# Patient Record
Sex: Male | Born: 1960 | Race: Black or African American | Hispanic: No | Marital: Single | Smoking: Current every day smoker
Health system: Southern US, Community
[De-identification: ages and names within clinical notes are randomized; demographics above are authoritative.]

## PROBLEM LIST (undated history)

## (undated) DIAGNOSIS — R519 Headache, unspecified: Secondary | ICD-10-CM

## (undated) DIAGNOSIS — I10 Essential (primary) hypertension: Secondary | ICD-10-CM

## (undated) HISTORY — PX: APPENDECTOMY: SHX54

## (undated) SURGERY — APPENDECTOMY, LAPAROSCOPIC
Anesthesia: General

---

## 1998-02-24 ENCOUNTER — Emergency Department (HOSPITAL_COMMUNITY): Admission: EM | Admit: 1998-02-24 | Discharge: 1998-02-24 | Payer: Self-pay | Admitting: Emergency Medicine

## 2001-09-05 ENCOUNTER — Emergency Department (HOSPITAL_COMMUNITY): Admission: EM | Admit: 2001-09-05 | Discharge: 2001-09-05 | Payer: Self-pay | Admitting: Emergency Medicine

## 2001-12-12 ENCOUNTER — Emergency Department (HOSPITAL_COMMUNITY): Admission: EM | Admit: 2001-12-12 | Discharge: 2001-12-13 | Payer: Self-pay | Admitting: Emergency Medicine

## 2002-02-20 ENCOUNTER — Encounter: Payer: Self-pay | Admitting: Oral Surgery

## 2002-02-20 ENCOUNTER — Inpatient Hospital Stay (HOSPITAL_COMMUNITY): Admission: EM | Admit: 2002-02-20 | Discharge: 2002-02-20 | Payer: Self-pay | Admitting: Emergency Medicine

## 2002-02-20 ENCOUNTER — Encounter: Payer: Self-pay | Admitting: Emergency Medicine

## 2004-05-04 ENCOUNTER — Emergency Department (HOSPITAL_COMMUNITY): Admission: EM | Admit: 2004-05-04 | Discharge: 2004-05-04 | Payer: Self-pay | Admitting: Emergency Medicine

## 2006-04-30 ENCOUNTER — Emergency Department (HOSPITAL_COMMUNITY): Admission: EM | Admit: 2006-04-30 | Discharge: 2006-04-30 | Payer: Self-pay | Admitting: Emergency Medicine

## 2009-07-10 ENCOUNTER — Emergency Department (HOSPITAL_COMMUNITY): Admission: EM | Admit: 2009-07-10 | Discharge: 2009-07-10 | Payer: Self-pay | Admitting: Emergency Medicine

## 2009-11-21 ENCOUNTER — Emergency Department (HOSPITAL_COMMUNITY): Admission: EM | Admit: 2009-11-21 | Discharge: 2009-11-21 | Payer: Self-pay | Admitting: Emergency Medicine

## 2009-12-01 ENCOUNTER — Emergency Department (HOSPITAL_COMMUNITY): Admission: EM | Admit: 2009-12-01 | Discharge: 2009-12-01 | Payer: Self-pay | Admitting: Emergency Medicine

## 2010-11-12 NOTE — Op Note (Signed)
NAME:  Duane Lambert, Duane Lambert                         ACCOUNT NO.:  [id number]   MEDICAL RECORD NO.:  [id number]                   PATIENT TYPE:  INP   LOCATION:  5735                                 FACILITY:  [facility]   PHYSICIAN:  Dora Sims, M.D.               DATE OF BIRTH:  1961-01-05   DATE OF PROCEDURE:  02/20/2002  DATE OF DISCHARGE:                                 OPERATIVE REPORT   PREOPERATIVE DIAGNOSES:  Right open parasymphysis fracture of the mandible,  left open body fracture of the mandible.   POSTOPERATIVE DIAGNOSES:  Right open parasymphysis fracture of the mandible,  left open body fracture of the mandible.   OPERATIVE PROCEDURE:  Closed reduction, maxillomandibular fixation (MMF).   ANESTHESIA:  General endotracheal tube.   SURGEON:  Dora Sims, M.D.   BRIEF HISTORY AND PHYSICAL:  This is a 50 year old gentleman who was seen in  the emergency department, status post blunt trauma to the face.  He had  adequate dentition on evaluation for stabilization of his mandible fractures  by means of a closed reduction procedure.  All appropriate consents were  obtained.  The patient was admitted to the hospital.   DESCRIPTION OF PROCEDURE:  He was maintained NPO the night before surgery,  brought to the operating room, placed in the supine position.  All  anesthesia monitors were found to be working appropriately.  He was  appropriately padded, and a nasoendotracheal tube intubation was completed  with minimal difficulty.  This was confirmed by clear bilateral breath  sounds, as well as positive end tidal CO2. Once this was done, the patient  was prepped and draped in the normal sterile fashion.  A throat pack was  placed.  At that time, then approximately 6 cc of 2% lidocaine with  1:100,000 parts epinephrine was injected into both maxillary and mandibular  vestibules with an intra-alveolar nerve block to the left side at the  fracture site.  Once this was done, a  24-gauge wire that had been  prestretched and cut was placed, circumdental wires around first the  maxillary teeth in order to stabilize the Erich arch bar for the maxilla.  The procedure was then carried out on the mandibular dentition in a similar  fashion.  A bridle wire was placed around the lower incisors in order to aid  in reduction of the fracture.  The throat pack was then removed.  The throat  was suctioned of all secretions, and he was placed into MMF.  This occlusion  was stable.  The patient was allowed to awaken from general anesthesia.  He  will be maintained on liquid pain killers, as well as antibiotics for  approximately ten days.  He will be on a full liquid diet and followed in my  office until complete healing of his bilateral mandible fractures.  Minimal  blood was lost.  No drains were placed.  Nothing was sent for pathology.                                               Dora Sims, M.D.    RJR/MEDQ  D:  02/26/2002  T:  02/27/2002  Job:  [id number]

## 2011-09-08 ENCOUNTER — Encounter (HOSPITAL_COMMUNITY): Payer: Self-pay | Admitting: *Deleted

## 2011-09-08 ENCOUNTER — Ambulatory Visit (HOSPITAL_COMMUNITY)
Admission: EM | Admit: 2011-09-08 | Discharge: 2011-09-10 | Disposition: A | Discharge status: Home / Self Care | Payer: Self-pay | Attending: Surgery | Admitting: Surgery

## 2011-09-08 DIAGNOSIS — F17200 Nicotine dependence, unspecified, uncomplicated: Secondary | ICD-10-CM | POA: Insufficient documentation

## 2011-09-08 DIAGNOSIS — K3580 Unspecified acute appendicitis: Secondary | ICD-10-CM | POA: Insufficient documentation

## 2011-09-08 DIAGNOSIS — R109 Unspecified abdominal pain: Secondary | ICD-10-CM | POA: Insufficient documentation

## 2011-09-08 DIAGNOSIS — K37 Unspecified appendicitis: Secondary | ICD-10-CM

## 2011-09-08 LAB — COMPREHENSIVE METABOLIC PANEL
Albumin: 4 g/dL (ref 3.5–5.2)
Alkaline Phosphatase: 80 U/L (ref 39–117)
BUN: 11 mg/dL (ref 6–23)
Chloride: 99 mEq/L (ref 96–112)
Creatinine, Ser: 0.61 mg/dL (ref 0.50–1.35)
GFR calc Af Amer: >90 mL/min (ref >90)
Glucose, Bld: 121 mg/dL — ABNORMAL HIGH (ref 70–99)
Potassium: 3.5 mEq/L (ref 3.5–5.1)
Total Bilirubin: 0.4 mg/dL (ref 0.3–1.2)

## 2011-09-08 LAB — LIPASE, BLOOD: Lipase: 12 U/L (ref 11–59)

## 2011-09-08 LAB — DIFFERENTIAL
Basophils Relative: 0 % (ref 0–1)
Eosinophils Absolute: 0 10*3/uL (ref 0.0–0.7)
Lymphs Abs: 1.8 10*3/uL (ref 0.7–4.0)
Monocytes Absolute: 1.4 10*3/uL — ABNORMAL HIGH (ref 0.1–1.0)
Monocytes Relative: 10 % (ref 3–12)
Neutrophils Relative %: 77 % (ref 43–77)

## 2011-09-08 LAB — CBC
HCT: 41.3 % (ref 39.0–52.0)
Hemoglobin: 14.4 g/dL (ref 13.0–17.0)
MCH: 29.4 pg (ref 26.0–34.0)
MCHC: 34.9 g/dL (ref 30.0–36.0)
RBC: 4.89 MIL/uL (ref 4.22–5.81)

## 2011-09-08 LAB — URINALYSIS, ROUTINE W REFLEX MICROSCOPIC
Bilirubin Urine: NEGATIVE
Glucose, UA: NEGATIVE mg/dL
Hgb urine dipstick: NEGATIVE
Ketones, ur: NEGATIVE mg/dL
Specific Gravity, Urine: 1.024 (ref 1.005–1.030)
pH: 8.5 — ABNORMAL HIGH (ref 5.0–8.0)

## 2011-09-08 LAB — URINE MICROSCOPIC-ADD ON

## 2011-09-08 NOTE — ED Notes (Signed)
Generalized abd pain since 1600 today no nor v.  No previous history

## 2011-09-09 ENCOUNTER — Emergency Department (HOSPITAL_COMMUNITY): Payer: Self-pay | Admitting: Certified Registered Nurse Anesthetist

## 2011-09-09 ENCOUNTER — Encounter (HOSPITAL_COMMUNITY): Payer: Self-pay | Admitting: Certified Registered Nurse Anesthetist

## 2011-09-09 ENCOUNTER — Inpatient Hospital Stay: Admit: 2011-09-09 | Payer: Self-pay | Admitting: General Surgery

## 2011-09-09 ENCOUNTER — Encounter (HOSPITAL_COMMUNITY): Payer: Self-pay | Admitting: Emergency Medicine

## 2011-09-09 ENCOUNTER — Emergency Department (HOSPITAL_COMMUNITY): Payer: Self-pay

## 2011-09-09 ENCOUNTER — Encounter (HOSPITAL_COMMUNITY)
Admission: EM | Disposition: A | Discharge status: Home / Self Care | Payer: Self-pay | Source: Home / Self Care | Attending: Emergency Medicine

## 2011-09-09 DIAGNOSIS — R1031 Right lower quadrant pain: Secondary | ICD-10-CM

## 2011-09-09 DIAGNOSIS — K3580 Unspecified acute appendicitis: Secondary | ICD-10-CM

## 2011-09-09 HISTORY — PX: LAPAROSCOPIC APPENDECTOMY: SHX408

## 2011-09-09 LAB — CBC
Hemoglobin: 13.4 g/dL (ref 13.0–17.0)
MCH: 29.2 pg (ref 26.0–34.0)
MCHC: 34.6 g/dL (ref 30.0–36.0)
MCV: 84.3 fL (ref 78.0–100.0)
Platelets: 185 10*3/uL (ref 150–400)
RBC: 4.59 MIL/uL (ref 4.22–5.81)

## 2011-09-09 LAB — CREATININE, SERUM: Creatinine, Ser: 0.57 mg/dL (ref 0.50–1.35)

## 2011-09-09 SURGERY — APPENDECTOMY, LAPAROSCOPIC
Anesthesia: General | Site: Abdomen | Wound class: Contaminated

## 2011-09-09 MED ORDER — ENOXAPARIN SODIUM 40 MG/0.4ML ~~LOC~~ SOLN
40.0000 mg | SUBCUTANEOUS | Status: DC
  Administered 2011-09-09: 40 mg via SUBCUTANEOUS
  Filled 2011-09-09 (×2): qty 0.4

## 2011-09-09 MED ORDER — 0.9 % SODIUM CHLORIDE (POUR BTL) OPTIME
TOPICAL | Status: DC | PRN
  Administered 2011-09-09: 1000 mL

## 2011-09-09 MED ORDER — MIDAZOLAM HCL 5 MG/5ML IJ SOLN
INTRAMUSCULAR | Status: DC | PRN
  Administered 2011-09-09: 2 mg via INTRAVENOUS

## 2011-09-09 MED ORDER — HYDROCODONE-ACETAMINOPHEN 5-325 MG PO TABS
1.0000 | ORAL_TABLET | ORAL | Status: DC | PRN
  Administered 2011-09-09 – 2011-09-10 (×3): 2 via ORAL
  Filled 2011-09-09 (×3): qty 2

## 2011-09-09 MED ORDER — ONDANSETRON HCL 4 MG PO TABS: 4.0000 mg | ORAL_TABLET | Freq: Four times a day (QID) | ORAL | Status: DC | PRN

## 2011-09-09 MED ORDER — LACTATED RINGERS IV SOLN
INTRAVENOUS | Status: DC | PRN
  Administered 2011-09-09: 06:00:00 via INTRAVENOUS

## 2011-09-09 MED ORDER — LIDOCAINE HCL (CARDIAC) 20 MG/ML IV SOLN
INTRAVENOUS | Status: DC | PRN
  Administered 2011-09-09: 60 mg via INTRAVENOUS

## 2011-09-09 MED ORDER — PROPOFOL 10 MG/ML IV EMUL
INTRAVENOUS | Status: DC | PRN
  Administered 2011-09-09: 160 mg via INTRAVENOUS

## 2011-09-09 MED ORDER — KETOROLAC TROMETHAMINE 30 MG/ML IJ SOLN
30.0000 mg | Freq: Once | INTRAMUSCULAR | Status: AC
  Administered 2011-09-09: 30 mg via INTRAVENOUS
  Filled 2011-09-09: qty 1

## 2011-09-09 MED ORDER — GLYCOPYRROLATE 0.2 MG/ML IJ SOLN
INTRAMUSCULAR | Status: DC | PRN
  Administered 2011-09-09: .5 mg via INTRAVENOUS

## 2011-09-09 MED ORDER — SODIUM CHLORIDE 0.9 % IV SOLN
Freq: Once | INTRAVENOUS | Status: AC
  Administered 2011-09-09: 03:00:00 via INTRAVENOUS

## 2011-09-09 MED ORDER — ROCURONIUM BROMIDE 100 MG/10ML IV SOLN
INTRAVENOUS | Status: DC | PRN
  Administered 2011-09-09: 30 mg via INTRAVENOUS

## 2011-09-09 MED ORDER — KCL IN DEXTROSE-NACL 20-5-0.45 MEQ/L-%-% IV SOLN
INTRAVENOUS | Status: DC
  Administered 2011-09-09: 1000 mL via INTRAVENOUS
  Administered 2011-09-10: 02:00:00 via INTRAVENOUS
  Filled 2011-09-09 (×3): qty 1000

## 2011-09-09 MED ORDER — IOHEXOL 300 MG/ML  SOLN
80.0000 mL | Freq: Once | INTRAMUSCULAR | Status: AC | PRN
  Administered 2011-09-09: 80 mL via INTRAVENOUS

## 2011-09-09 MED ORDER — BUPIVACAINE-EPINEPHRINE 0.25% -1:200000 IJ SOLN
INTRAMUSCULAR | Status: DC | PRN
  Administered 2011-09-09: 1 mL

## 2011-09-09 MED ORDER — ONDANSETRON HCL 4 MG/2ML IJ SOLN: 4.0000 mg | Freq: Four times a day (QID) | INTRAMUSCULAR | Status: DC | PRN

## 2011-09-09 MED ORDER — FENTANYL CITRATE 0.05 MG/ML IJ SOLN
INTRAMUSCULAR | Status: DC | PRN
  Administered 2011-09-09 (×3): 50 ug via INTRAVENOUS
  Administered 2011-09-09: 100 ug via INTRAVENOUS

## 2011-09-09 MED ORDER — CEFOXITIN SODIUM 1 G IV SOLR
1.0000 g | INTRAVENOUS | Status: DC | PRN
  Administered 2011-09-09: 1 g via INTRAVENOUS

## 2011-09-09 MED ORDER — KETOROLAC TROMETHAMINE 15 MG/ML IJ SOLN
15.0000 mg | Freq: Four times a day (QID) | INTRAMUSCULAR | Status: DC
  Administered 2011-09-09 – 2011-09-10 (×4): 15 mg via INTRAVENOUS
  Filled 2011-09-09 (×8): qty 1

## 2011-09-09 MED ORDER — HYDROMORPHONE HCL PF 1 MG/ML IJ SOLN
0.2500 mg | INTRAMUSCULAR | Status: DC | PRN
  Administered 2011-09-09 (×2): 0.5 mg via INTRAVENOUS

## 2011-09-09 MED ORDER — SODIUM CHLORIDE 0.9 % IR SOLN
Status: DC | PRN
  Administered 2011-09-09: 1000 mL

## 2011-09-09 MED ORDER — SODIUM CHLORIDE 0.9 % IV SOLN
INTRAVENOUS | Status: DC | PRN
  Administered 2011-09-09: 06:00:00 via INTRAVENOUS

## 2011-09-09 MED ORDER — HYDROMORPHONE HCL PF 1 MG/ML IJ SOLN: 1.0000 mg | INTRAMUSCULAR | Status: DC | PRN

## 2011-09-09 MED ORDER — ONDANSETRON HCL 4 MG/2ML IJ SOLN
INTRAMUSCULAR | Status: DC | PRN
  Administered 2011-09-09: 4 mg via INTRAVENOUS

## 2011-09-09 MED ORDER — IOHEXOL 300 MG/ML  SOLN
20.0000 mL | INTRAMUSCULAR | Status: DC
  Administered 2011-09-09: 20 mL via ORAL

## 2011-09-09 MED ORDER — NEOSTIGMINE METHYLSULFATE 1 MG/ML IJ SOLN
INTRAMUSCULAR | Status: DC | PRN
  Administered 2011-09-09: 3 mg via INTRAVENOUS

## 2011-09-09 SURGICAL SUPPLY — 51 items
ADH SKN CLS APL DERMABOND .7 (GAUZE/BANDAGES/DRESSINGS) ×1
ADH SKN CLS LQ APL DERMABOND (GAUZE/BANDAGES/DRESSINGS) ×1
APPLIER CLIP ROT 10 11.4 M/L (STAPLE)
APR CLP MED LRG 11.4X10 (STAPLE)
BAG SPEC RTRVL LRG 6X4 10 (ENDOMECHANICALS) ×1
BLADE SURG ROTATE 9660 (MISCELLANEOUS) ×1 IMPLANT
CANISTER SUCTION 2500CC (MISCELLANEOUS) ×2 IMPLANT
CHLORAPREP W/TINT 26ML (MISCELLANEOUS) ×2 IMPLANT
CLIP APPLIE ROT 10 11.4 M/L (STAPLE) IMPLANT
CLOTH BEACON ORANGE TIMEOUT ST (SAFETY) ×2 IMPLANT
COVER SURGICAL LIGHT HANDLE (MISCELLANEOUS) ×2 IMPLANT
CUTTER LINEAR ENDO 35 ETS (STAPLE) ×1 IMPLANT
CUTTER LINEAR ENDO 35 ETS TH (STAPLE) IMPLANT
DECANTER SPIKE VIAL GLASS SM (MISCELLANEOUS) ×1 IMPLANT
DERMABOND ADHESIVE PROPEN (GAUZE/BANDAGES/DRESSINGS) ×1
DERMABOND ADVANCED (GAUZE/BANDAGES/DRESSINGS) ×1
DERMABOND ADVANCED .7 DNX12 (GAUZE/BANDAGES/DRESSINGS) ×1 IMPLANT
DERMABOND ADVANCED .7 DNX6 (GAUZE/BANDAGES/DRESSINGS) IMPLANT
DRAPE UTILITY 15X26 W/TAPE STR (DRAPE) ×4 IMPLANT
DRSG TEGADERM 4X4.75 (GAUZE/BANDAGES/DRESSINGS) ×1 IMPLANT
ELECT REM PT RETURN 9FT ADLT (ELECTROSURGICAL) ×2
ELECTRODE REM PT RTRN 9FT ADLT (ELECTROSURGICAL) ×1 IMPLANT
ENDOLOOP SUT PDS II  0 18 (SUTURE)
ENDOLOOP SUT PDS II 0 18 (SUTURE) IMPLANT
GLOVE BIO SURGEON STRL SZ 6.5 (GLOVE) ×2 IMPLANT
GLOVE BIOGEL PI IND STRL 7.0 (GLOVE) IMPLANT
GLOVE BIOGEL PI IND STRL 8 (GLOVE) ×1 IMPLANT
GLOVE BIOGEL PI INDICATOR 7.0 (GLOVE) ×1
GLOVE BIOGEL PI INDICATOR 8 (GLOVE) ×1
GLOVE ECLIPSE 7.5 STRL STRAW (GLOVE) ×2 IMPLANT
GOWN STRL NON-REIN LRG LVL3 (GOWN DISPOSABLE) ×4 IMPLANT
KIT BASIN OR (CUSTOM PROCEDURE TRAY) ×2 IMPLANT
KIT ROOM TURNOVER OR (KITS) ×2 IMPLANT
NS IRRIG 1000ML POUR BTL (IV SOLUTION) ×2 IMPLANT
PAD ARMBOARD 7.5X6 YLW CONV (MISCELLANEOUS) ×4 IMPLANT
PENCIL BUTTON HOLSTER BLD 10FT (ELECTRODE) IMPLANT
POUCH SPECIMEN RETRIEVAL 10MM (ENDOMECHANICALS) ×2 IMPLANT
RELOAD /EVU35 (ENDOMECHANICALS) ×1 IMPLANT
RELOAD CUTTER ETS 35MM STAND (ENDOMECHANICALS) IMPLANT
SET IRRIG TUBING LAPAROSCOPIC (IRRIGATION / IRRIGATOR) ×2 IMPLANT
SPECIMEN JAR SMALL (MISCELLANEOUS) ×2 IMPLANT
STRIP CLOSURE SKIN 1/2X4 (GAUZE/BANDAGES/DRESSINGS) ×1 IMPLANT
SUT MNCRL AB 4-0 PS2 18 (SUTURE) ×2 IMPLANT
TOWEL OR 17X24 6PK STRL BLUE (TOWEL DISPOSABLE) ×1 IMPLANT
TOWEL OR 17X26 10 PK STRL BLUE (TOWEL DISPOSABLE) ×2 IMPLANT
TRAY FOLEY CATH 14FR (SET/KITS/TRAYS/PACK) ×2 IMPLANT
TRAY LAPAROSCOPIC (CUSTOM PROCEDURE TRAY) ×2 IMPLANT
TROCAR XCEL 12X100 BLDLESS (ENDOMECHANICALS) ×2 IMPLANT
TROCAR XCEL BLUNT TIP 100MML (ENDOMECHANICALS) ×2 IMPLANT
TROCAR XCEL NON-BLD 5MMX100MML (ENDOMECHANICALS) ×2 IMPLANT
WATER STERILE IRR 1000ML POUR (IV SOLUTION) IMPLANT

## 2011-09-09 NOTE — ED Notes (Signed)
OR called and states that they are ready for patient; prepping patient for surgery.  Patient undressed; changed into gown; belongings in bag.  OR Consent form signed and placed in chart.

## 2011-09-09 NOTE — Progress Notes (Signed)
Patient ID: Duane Lambert, male   DOB: June 14, 1961, 51 y.o.   MRN: [id number] Day of Surgery  Subjective: Pt c/o soreness, but minimal pain.  Tolerating liquids.  Doesn't want to go home tonight.  Objective: Vital signs in last 24 hours: Temp:  [97.4 F (36.3 C)-98.7 F (37.1 C)] 98.5 F (36.9 C) (03/15 1300) Pulse Rate:  [62-88] 66  (03/15 1300) Resp:  [12-28] 16  (03/15 1300) BP: (100-155)/(54-97) 130/87 mmHg (03/15 1300) SpO2:  [94 %-100 %] 99 % (03/15 1300) Weight:  [135 lb (61.236 kg)] 135 lb (61.236 kg) (03/15 0900) Last BM Date: 09/08/11  Intake/Output from previous day: 03/14 0701 - 03/15 0700 In: 1600 [I.V.:1600] Out: 250 [Urine:225; Blood:25] Intake/Output this shift: Total I/O In: 450 [I.V.:450] Out: 1025 [Urine:1025]  PE: Abd: soft, appropriately tender, +BS, ND, incisions c/d/i  Lab Results:   Basename 09/09/11 1015 09/08/11 2218  WBC 12.0* 13.9*  HGB 13.4 14.4  HCT 38.7* 41.3  PLT 185 196   BMET  Basename 09/09/11 1015 09/08/11 2218  NA -- 135  K -- 3.5  CL -- 99  CO2 -- 24  GLUCOSE -- 121*  BUN -- 11  CREATININE 0.57 0.61  CALCIUM -- 9.4   PT/INR No results found for this basename: LABPROT:2,INR:2 in the last 72 hours   Studies/Results: Ct Abdomen Pelvis W Contrast  09/09/2011  *RADIOLOGY REPORT*  Clinical Data: Diffuse abdominal  CT ABDOMEN AND PELVIS WITH CONTRAST  Technique:  Multidetector CT imaging of the abdomen and pelvis was performed following the standard protocol during bolus administration of intravenous contrast.  Contrast: 80mL OMNIPAQUE IOHEXOL 300 MG/ML IJ SOLN pain  Comparison: None.  Findings: Limited images through the lung bases demonstrate no significant appreciable abnormality. The heart size is within normal limits. No pleural or pericardial effusion.  Unremarkable liver, biliary system, spleen, pancreas, adrenal glands.  Symmetric renal enhancement.  No hydronephrosis or hydroureter.  The appendix is distended up to 1.1  cm.  There is mucosal hyperenhancement and mucosal edema.  Mild periappendiceal fat stranding.  No bowel obstruction.  No CT evidence for colitis.  No free intraperitoneal air.  Decompressed bladder.  No lymphadenopathy.  There is scattered atherosclerotic calcification of the aorta and its branches. No aneurysmal dilatation.  Bilateral SI joint ankylosis.  No acute osseous abnormality.  IMPRESSION: Acute appendicitis.  Original Report Authenticated By: Waneta Martins, M.D.    Anti-infectives: Anti-infectives    None       Assessment/Plan  1. S/p lap appy  Plan: 1. Advance diet 2. Anticipate dc in am.   LOS: 1 day    Amarri Satterly E 09/09/2011

## 2011-09-09 NOTE — Anesthesia Preprocedure Evaluation (Addendum)
Anesthesia Evaluation  Patient identified by MRN, date of birth, ID band Patient awake    Reviewed: Allergy & Precautions, H&P , NPO status , Patient's Chart, lab work & pertinent test results  Airway Mallampati: III TM Distance: >3 FB Neck ROM: Full    Dental  (+)  Previous hx of fractured mandible. S/P ORIF:   Pulmonary Current Smoker,          Cardiovascular     Neuro/Psych    GI/Hepatic   Endo/Other    Renal/GU      Musculoskeletal   Abdominal   Peds  Hematology   Anesthesia Other Findings   Reproductive/Obstetrics                          Anesthesia Physical Anesthesia Plan  ASA: II and Emergent  Anesthesia Plan: General   Post-op Pain Management:    Induction: Intravenous, Rapid sequence and Cricoid pressure planned  Airway Management Planned: Oral ETT  Additional Equipment:   Intra-op Plan:   Post-operative Plan: Extubation in OR  Informed Consent: I have reviewed the patients History and Physical, chart, labs and discussed the procedure including the risks, benefits and alternatives for the proposed anesthesia with the patient or authorized representative who has indicated his/her understanding and acceptance.   Dental advisory given  Plan Discussed with: CRNA and Anesthesiologist  Anesthesia Plan Comments:         Anesthesia Quick Evaluation

## 2011-09-09 NOTE — H&P (Addendum)
Duane Lambert is an 51 y.o. male.   Chief Complaint: Abdominal pain HPI: Well until about 4:00 PM yesterday, developed periumbilical pain, now localized to RLQ.  History reviewed. No pertinent past medical history.  History reviewed. No pertinent past surgical history.  History reviewed. No pertinent family history. Social History:  reports that he has been smoking.  He does not have any smokeless tobacco history on file. He reports that he does not drink alcohol. His drug history not on file.  Allergies: No Known Allergies  Medications Prior to Admission  Medication Dose Route Frequency Provider Last Rate Last Dose  . 0.9 %  sodium chloride infusion   Intravenous Once Nat Christen, MD 125 mL/hr at 09/09/11 0304    . iohexol (OMNIPAQUE) 300 MG/ML solution 20 mL  20 mL Oral Q1 Hr x 2 Medication Radiologist, MD   20 mL at 09/09/11 0254  . iohexol (OMNIPAQUE) 300 MG/ML solution 80 mL  80 mL Intravenous Once PRN Medication Radiologist, MD   80 mL at 09/09/11 0330   No current outpatient prescriptions on file as of 09/08/2011.    Results for orders placed during the hospital encounter of 09/08/11 (from the past 48 hour(s))  CBC     Status: Abnormal   Collection Time   09/08/11 10:18 PM      Component Value Range Comment   WBC 13.9 (*) 4.0 - 10.5 (K/uL)    RBC 4.89  4.22 - 5.81 (MIL/uL)    Hemoglobin 14.4  13.0 - 17.0 (g/dL)    HCT 62.9  52.8 - 41.3 (%)    MCV 84.5  78.0 - 100.0 (fL)    MCH 29.4  26.0 - 34.0 (pg)    MCHC 34.9  30.0 - 36.0 (g/dL)    RDW 24.4  01.0 - 27.2 (%)    Platelets 196  150 - 400 (K/uL)   DIFFERENTIAL     Status: Abnormal   Collection Time   09/08/11 10:18 PM      Component Value Range Comment   Neutrophils Relative 77  43 - 77 (%)    Neutro Abs 10.7 (*) 1.7 - 7.7 (K/uL)    Lymphocytes Relative 13  12 - 46 (%)    Lymphs Abs 1.8  0.7 - 4.0 (K/uL)    Monocytes Relative 10  3 - 12 (%)    Monocytes Absolute 1.4 (*) 0.1 - 1.0 (K/uL)    Eosinophils Relative  0  0 - 5 (%)    Eosinophils Absolute 0.0  0.0 - 0.7 (K/uL)    Basophils Relative 0  0 - 1 (%)    Basophils Absolute 0.0  0.0 - 0.1 (K/uL)   COMPREHENSIVE METABOLIC PANEL     Status: Abnormal   Collection Time   09/08/11 10:18 PM      Component Value Range Comment   Sodium 135  135 - 145 (mEq/L)    Potassium 3.5  3.5 - 5.1 (mEq/L)    Chloride 99  96 - 112 (mEq/L)    CO2 24  19 - 32 (mEq/L)    Glucose, Bld 121 (*) 70 - 99 (mg/dL)    BUN 11  6 - 23 (mg/dL)    Creatinine, Ser 5.36  0.50 - 1.35 (mg/dL)    Calcium 9.4  8.4 - 10.5 (mg/dL)    Total Protein 7.7  6.0 - 8.3 (g/dL)    Albumin 4.0  3.5 - 5.2 (g/dL)    AST 31  0 -  37 (U/L)    ALT 24  0 - 53 (U/L)    Alkaline Phosphatase 80  39 - 117 (U/L)    Total Bilirubin 0.4  0.3 - 1.2 (mg/dL)    GFR calc non Af Amer >90  >90 (mL/min)    GFR calc Af Amer >90  >90 (mL/min)   LIPASE, BLOOD     Status: Normal   Collection Time   09/08/11 10:18 PM      Component Value Range Comment   Lipase 12  11 - 59 (U/L)   URINALYSIS, ROUTINE W REFLEX MICROSCOPIC     Status: Abnormal   Collection Time   09/08/11 10:20 PM      Component Value Range Comment   Color, Urine YELLOW  YELLOW     APPearance CLOUDY (*) CLEAR     Specific Gravity, Urine 1.024  1.005 - 1.030     pH 8.5 (*) 5.0 - 8.0     Glucose, UA NEGATIVE  NEGATIVE (mg/dL)    Hgb urine dipstick NEGATIVE  NEGATIVE     Bilirubin Urine NEGATIVE  NEGATIVE     Ketones, ur NEGATIVE  NEGATIVE (mg/dL)    Protein, ur 161 (*) NEGATIVE (mg/dL)    Urobilinogen, UA 1.0  0.0 - 1.0 (mg/dL)    Nitrite NEGATIVE  NEGATIVE     Leukocytes, UA NEGATIVE  NEGATIVE    URINE MICROSCOPIC-ADD ON     Status: Normal   Collection Time   09/08/11 10:20 PM      Component Value Range Comment   Squamous Epithelial / LPF RARE  RARE     WBC, UA 0-2  <3 (WBC/hpf)    RBC / HPF 0-2  <3 (RBC/hpf)    Bacteria, UA RARE  RARE     Urine-Other AMORPHOUS URATES/PHOSPHATES   MUCOUS PRESENT   Ct Abdomen Pelvis W  Contrast  09/09/2011  *RADIOLOGY REPORT*  Clinical Data: Diffuse abdominal  CT ABDOMEN AND PELVIS WITH CONTRAST  Technique:  Multidetector CT imaging of the abdomen and pelvis was performed following the standard protocol during bolus administration of intravenous contrast.  Contrast: 80mL OMNIPAQUE IOHEXOL 300 MG/ML IJ SOLN pain  Comparison: None.  Findings: Limited images through the lung bases demonstrate no significant appreciable abnormality. The heart size is within normal limits. No pleural or pericardial effusion.  Unremarkable liver, biliary system, spleen, pancreas, adrenal glands.  Symmetric renal enhancement.  No hydronephrosis or hydroureter.  The appendix is distended up to 1.1 cm.  There is mucosal hyperenhancement and mucosal edema.  Mild periappendiceal fat stranding.  No bowel obstruction.  No CT evidence for colitis.  No free intraperitoneal air.  Decompressed bladder.  No lymphadenopathy.  There is scattered atherosclerotic calcification of the aorta and its branches. No aneurysmal dilatation.  Bilateral SI joint ankylosis.  No acute osseous abnormality.  IMPRESSION: Acute appendicitis.  Original Report Authenticated By: Waneta Martins, M.D.    Review of Systems  Constitutional: Negative.   HENT: Negative.   Eyes: Negative.   Respiratory: Negative.   Cardiovascular: Negative.   Gastrointestinal: Negative.   Genitourinary: Negative.   Musculoskeletal: Negative.   Skin: Negative.   Neurological: Negative.   Endo/Heme/Allergies: Negative.   Psychiatric/Behavioral: Negative.     Blood pressure 101/54, pulse 75, temperature 98.5 F (36.9 C), temperature source Oral, resp. rate 20, SpO2 100.00%. Physical Exam  Constitutional: He is oriented to person, place, and time. He appears well-developed and well-nourished.  HENT:  Head: Normocephalic and atraumatic.  Eyes:  Conjunctivae and EOM are normal. Pupils are equal, round, and reactive to light.  Neck: Normal range of motion.  Neck supple.  Cardiovascular: Normal rate, regular rhythm and normal heart sounds.   Respiratory: Effort normal and breath sounds normal.  GI: Soft. Bowel sounds are normal. He exhibits no mass. There is tenderness (RLQ tenderness). There is rebound and guarding.  Genitourinary: Penis normal.  Musculoskeletal: Normal range of motion.  Neurological: He is alert and oriented to person, place, and time. He has normal reflexes.  Skin: Skin is warm and dry.  Psychiatric: He has a normal mood and affect. His behavior is normal. Judgment and thought content normal.     Assessment/Plan Acute appendicitis  Appendectomy, likely laparoscopic ASAP Preop Unasyn  Nalu Troublefield III,Tomy Khim O 09/09/2011, 5:03 AM

## 2011-09-09 NOTE — Op Note (Signed)
OPERATIVE REPORT  DATE OF OPERATION: 09/08/2011 - 09/09/2011  PATIENT:  Duane Lambert  51 y.o. male  PRE-OPERATIVE DIAGNOSIS:  acute appendicitis  POST-OPERATIVE DIAGNOSIS:  acute appendicitis  PROCEDURE:  Procedure(s): APPENDECTOMY LAPAROSCOPIC  SURGEON:  Surgeon(s): Frederik Schmidt, MD  ASSISTANT: None  ANESTHESIA:   general  EBL: <20 ml  BLOOD ADMINISTERED: none  DRAINS: none   SPECIMEN:  Source of Specimen:  Appendix  COUNTS CORRECT:  YES  PROCEDURE DETAILS: The patient was taken to the operating room and placed on the table in the supine position. After an adequate endotracheal anesthetic was administered and a Foley catheter was placed he was prepped and draped in usual sterile manner exposing its entire abdomen.  After proper time out was performed identifying the patient and procedure to be performed a supraumbilical midline incision was made using #15 blade and taken down to the midline fascia. The fascia was cut with a 15 blade then the edges Grabill Coker clamps. We bluntly dissected down into the perineal cavity once we were in the peritoneal cavity a purse string suture of 0 Vicryl was passed around the fascial opening. A Hassan cannula was then passed into the peritoneal cavity and sutured in place with a pursestring suture.  Right upper quadrant 5 mm cannula in the left lower quadrant 12 mm cannula passed under direct vision. The patient was placed in Trendelenburg the left side was tilted down.  Plan the appendix coursed medially and inferiorly towards the pelvis. We able to dissect away the appendix from the base of the cecum and the mesentery using dissectors. 3.5 mm balloon Endo GIA was passed along the base of the appendix and fired. This detached appendix from the cecum. We then used a 2.5 mm white Endo GIA across the mesoappendix was completely detached and appendix and waned retriever from the left lower quadrant site using an Endo Catch bag.  We irrigated with  small amount of saline. Does minimal bleeding we aspirated all fluid and gas from above the liver. The supraumbilical site was closed using the fascial stitch pursestring which was in place. We injected all sites quarter percent Marcaine with epi. The left lower quadrant and supraumbilical skin sites were closed using running subcuticular stitch of 4-0 Monocryl. Dermabond Steri-Strips and Tegaderms use complete our dressings. All needle counts sponge counts and instrument counts were correct.  PATIENT DISPOSITION:  PACU - hemodynamically stable.   Bond Grieshop III,Zafir Schauer O 3/15/20136:42 AM

## 2011-09-09 NOTE — Transfer of Care (Signed)
Immediate Anesthesia Transfer of Care Note  Patient: Duane Lambert  Procedure(s) Performed: Procedure(s) (LRB): APPENDECTOMY LAPAROSCOPIC (N/A)  Patient Location: PACU  Anesthesia Type: General  Level of Consciousness: awake and sedated  Airway & Oxygen Therapy: Patient Spontanous Breathing and Patient connected to nasal cannula oxygen  Post-op Assessment: Report given to PACU RN and Post -op Vital signs reviewed and stable  Post vital signs: Reviewed and stable  Complications: No apparent anesthesia complications

## 2011-09-09 NOTE — Progress Notes (Signed)
Home sat.

## 2011-09-09 NOTE — ED Provider Notes (Signed)
History     CSN: [id number]  Arrival date & time 09/08/11  2209   First MD Initiated Contact with Patient 09/09/11 [phone]      Chief Complaint  Patient presents with  . Abdominal Pain    (Consider location/radiation/quality/duration/timing/severity/associated sxs/prior treatment) HPI Comments: Patient isn't complaining of abdominal pain began at approximately 4 PM.  He notes that it's been waxing and waning but it's been unlikely any pain he is ever had.  He felt like he needed to vomit but has been unable to do so.  No fevers.  Patient does not localize the pain on exam he just states that it's all over.  No specific inciting or relieving factors.  No changes in bowel habits.  No prior abdominal surgeries.  Patient is a 51 y.o. male presenting with abdominal pain. The history is provided by the patient. No language interpreter was used.  Abdominal Pain The primary symptoms of the illness include abdominal pain and nausea. The primary symptoms of the illness do not include fever, fatigue, shortness of breath, vomiting or diarrhea. The current episode started 13 to 24 hours ago.  Symptoms associated with the illness do not include chills, hematuria or back pain.    History reviewed. No pertinent past medical history.  History reviewed. No pertinent past surgical history.  History reviewed. No pertinent family history.  History  Substance Use Topics  . Smoking status: Current Everyday Smoker  . Smokeless tobacco: Not on file  . Alcohol Use: No      Review of Systems  Constitutional: Negative.  Negative for fever, chills and fatigue.  HENT: Negative.   Eyes: Negative.  Negative for discharge and redness.  Respiratory: Negative.  Negative for cough and shortness of breath.   Cardiovascular: Negative.  Negative for chest pain.  Gastrointestinal: Positive for nausea and abdominal pain. Negative for vomiting and diarrhea.  Genitourinary: Negative.  Negative for hematuria.    Musculoskeletal: Negative.  Negative for back pain.  Skin: Negative.  Negative for color change and rash.  Neurological: Negative for syncope and headaches.  Hematological: Negative.  Negative for adenopathy.  Psychiatric/Behavioral: Negative.  Negative for confusion.  All other systems reviewed and are negative.    Allergies  Review of patient's allergies indicates no known allergies.  Home Medications  No current outpatient prescriptions on file.  BP 107/63  Pulse 62  Temp(Src) 97.5 F (36.4 C) (Oral)  Resp 18  SpO2 100%  Physical Exam  Nursing note and vitals reviewed. Constitutional: He is oriented to person, place, and time. He appears well-developed and well-nourished.  Non-toxic appearance. He does not have a sickly appearance.  HENT:  Head: Normocephalic and atraumatic.  Eyes: Conjunctivae, EOM and lids are normal. Pupils are equal, round, and reactive to light.  Neck: Trachea normal, normal range of motion and full passive range of motion without pain. Neck supple.  Cardiovascular: Normal rate, regular rhythm and normal heart sounds.   Pulmonary/Chest: Effort normal and breath sounds normal. No respiratory distress.  Abdominal: Soft. Normal appearance. He exhibits no distension. There is tenderness. There is no rebound and no CVA tenderness.       Patient has right upper quadrant and right lower quadrant tenderness on examination  Musculoskeletal: Normal range of motion.  Neurological: He is alert and oriented to person, place, and time. He has normal strength.  Skin: Skin is warm, dry and intact. No rash noted.  Psychiatric: He has a normal mood and affect. His behavior is normal.  Judgment and thought content normal.    ED Course  Procedures (including critical care time)  Labs Reviewed  URINALYSIS, ROUTINE W REFLEX MICROSCOPIC - Abnormal; Notable for the following:    APPearance CLOUDY (*)    pH 8.5 (*)    Protein, ur 100 (*)    All other components within  normal limits  CBC - Abnormal; Notable for the following:    WBC 13.9 (*)    All other components within normal limits  DIFFERENTIAL - Abnormal; Notable for the following:    Neutro Abs 10.7 (*)    Monocytes Absolute 1.4 (*)    All other components within normal limits  COMPREHENSIVE METABOLIC PANEL - Abnormal; Notable for the following:    Glucose, Bld 121 (*)    All other components within normal limits  LIPASE, BLOOD  URINE MICROSCOPIC-ADD ON   Ct Abdomen Pelvis W Contrast  09/09/2011  *RADIOLOGY REPORT*  Clinical Data: Diffuse abdominal  CT ABDOMEN AND PELVIS WITH CONTRAST  Technique:  Multidetector CT imaging of the abdomen and pelvis was performed following the standard protocol during bolus administration of intravenous contrast.  Contrast: 80mL OMNIPAQUE IOHEXOL 300 MG/ML IJ SOLN pain  Comparison: None.  Findings: Limited images through the lung bases demonstrate no significant appreciable abnormality. The heart size is within normal limits. No pleural or pericardial effusion.  Unremarkable liver, biliary system, spleen, pancreas, adrenal glands.  Symmetric renal enhancement.  No hydronephrosis or hydroureter.  The appendix is distended up to 1.1 cm.  There is mucosal hyperenhancement and mucosal edema.  Mild periappendiceal fat stranding.  No bowel obstruction.  No CT evidence for colitis.  No free intraperitoneal air.  Decompressed bladder.  No lymphadenopathy.  There is scattered atherosclerotic calcification of the aorta and its branches. No aneurysmal dilatation.  Bilateral SI joint ankylosis.  No acute osseous abnormality.  IMPRESSION: Acute appendicitis.  Original Report Authenticated By: Waneta Martins, M.D.     No diagnosis found.    MDM  Patient presented with abdominal pain that is new for him with a leukocytosis on his laboratory studies and right lower quadrant tenderness on exam.  I performed a CT scan to evaluate for possible appendicitis which is present on his  scan today.  I contacted Dr. Lindie Spruce for evaluation and admission of this patient presented appendicitis.  Patient has been kept n.p.o. outside of the fact that he drank by mouth contrast the scan.        Nat Christen, MD 09/09/11 [phone]

## 2011-09-09 NOTE — ED Notes (Signed)
Patient currently lying in bed; no respiratory or acute distress noted.  Patient updated on plan of care; informed patient that CT results are back and that we are waiting on the EDP to come and talk about test results. Patient has no other questions or concerns at this time; denies pain.  Will continue to monitor.

## 2011-09-09 NOTE — ED Notes (Signed)
Pt c/o generalized abd pain starting at 1600, pt denies N/V/D, burning w/urination, or low back pain. No previous hx of the same

## 2011-09-09 NOTE — Preoperative (Signed)
Beta Blockers   Reason not to administer Beta Blockers:Not Applicable 

## 2011-09-09 NOTE — ED Notes (Signed)
Patient transported to CT 

## 2011-09-09 NOTE — ED Notes (Signed)
Received bedside report from [place], [place].  Patient being transported to CT by x-ray tech at this time; will continue to monitor.

## 2011-09-09 NOTE — Anesthesia Postprocedure Evaluation (Signed)
  Anesthesia Post-op Note  Patient: Duane Lambert  Procedure(s) Performed: Procedure(s) (LRB): APPENDECTOMY LAPAROSCOPIC (N/A)  Patient Location: PACU  Anesthesia Type: General  Level of Consciousness: awake  Airway and Oxygen Therapy: Patient Spontanous Breathing and Patient connected to nasal cannula oxygen  Post-op Pain: moderate  Post-op Assessment: Post-op Vital signs reviewed, Patient's Cardiovascular Status Stable, Respiratory Function Stable, Patent Airway and No signs of Nausea or vomiting  Post-op Vital Signs: Reviewed and stable  Complications: No apparent anesthesia complications

## 2011-09-09 NOTE — ED Notes (Signed)
Dr. Lindie Spruce at bedside talking to patient about surgery.

## 2011-09-09 NOTE — ED Notes (Signed)
Pt reports last meal was 0930 09/08/11, pt reports eating scrambled eggs, toast, jelly, grits, spam, and 3 cups of coffee

## 2011-09-09 NOTE — Anesthesia Procedure Notes (Signed)
Procedure Name: Intubation Date/Time: 09/09/2011 5:49 AM Performed by: Julianne Rice Z Pre-anesthesia Checklist: Patient identified, Timeout performed, Emergency Drugs available, Suction available and Patient being monitored Patient Re-evaluated:Patient Re-evaluated prior to inductionOxygen Delivery Method: Circle system utilized Preoxygenation: Pre-oxygenation with 100% oxygen Intubation Type: IV induction and Cricoid Pressure applied Laryngoscope Size: Mac and 3 Grade View: Grade I Tube type: Oral Tube size: 7.5 mm Number of attempts: 1 Airway Equipment and Method: Stylet Placement Confirmation: ETT inserted through vocal cords under direct vision,  breath sounds checked- equal and bilateral and positive ETCO2 Secured at: 22 cm Tube secured with: Tape Dental Injury: Teeth and Oropharynx as per pre-operative assessment

## 2011-09-09 NOTE — Discharge Instructions (Signed)
[facility] ______[facility] °LAPAROSCOPIC SURGERY: POST OP INSTRUCTIONS °Always review your discharge instruction sheet given to you by the facility where your surgery was performed. °IF YOU HAVE DISABILITY OR FAMILY LEAVE FORMS, YOU MUST BRING THEM TO THE OFFICE FOR PROCESSING.   °DO NOT GIVE THEM TO YOUR DOCTOR. ° °1. A prescription for pain medication may be given to you upon discharge.  Take your pain medication as prescribed, if needed.  If narcotic pain medicine is not needed, then you may take acetaminophen (Tylenol) or ibuprofen (Advil) as needed. °2. Take your usually prescribed medications unless otherwise directed. °3. If you need a refill on your pain medication, please contact your pharmacy.  They will contact our office to request authorization. Prescriptions will not be filled after 5pm or on week-ends. °4. You should follow a light diet the first few days after arrival home, such as soup and crackers, etc.  Be sure to include lots of fluids daily. °5. Most patients will experience some swelling and bruising in the area of the incisions.  Ice packs will help.  Swelling and bruising can take several days to resolve.  °6. It is common to experience some constipation if taking pain medication after surgery.  Increasing fluid intake and taking a stool softener (such as Colace) will usually help or prevent this problem from occurring.  A mild laxative (Milk of Magnesia or Miralax) should be taken according to package instructions if there are no bowel movements after 48 hours. °7. Unless discharge instructions indicate otherwise, you may remove your bandages 24-48 hours after surgery, and you may shower at that time.  You may have steri-strips (small skin tapes) in place directly over the incision.  These strips should be left on the skin for 7-10 days.  If your surgeon used skin glue on the incision, you may shower in 24 hours.  The glue will flake off over the next 2-3 weeks.  Any sutures or  staples will be removed at the office during your follow-up visit. °8. ACTIVITIES:  You may resume regular (light) daily activities beginning the next day--such as daily self-care, walking, climbing stairs--gradually increasing activities as tolerated.  You may have sexual intercourse when it is comfortable.  Refrain from any heavy lifting or straining until approved by your doctor. °a. You may drive when you are no longer taking prescription pain medication, you can comfortably wear a seatbelt, and you can safely maneuver your car and apply brakes. °b. RETURN TO WORK:  __________________________________________________________ °9. You should see your doctor in the office for a follow-up appointment approximately 2-3 weeks after your surgery.  Make sure that you call for this appointment within a day or two after you arrive home to insure a convenient appointment time. °10. OTHER INSTRUCTIONS: __________________________________________________________________________________________________________________________ __________________________________________________________________________________________________________________________ °WHEN TO CALL YOUR DOCTOR: °1. Fever over 101.0 °2. Inability to urinate °3. Continued bleeding from incision. °4. Increased pain, redness, or drainage from the incision. °5. Increasing abdominal pain ° °The clinic staff is available to answer your questions during regular business hours.  Please don’t hesitate to call and ask to speak to one of the nurses for clinical concerns.  If you have a medical emergency, go to the nearest emergency room or call 911.  A surgeon from [facility] is always on call at the hospital. °[address], [address], [place], [place]  [address] ? [address] [address], [place], [place]   [address] °[phone] ? [phone] ? FAX [phone] °Web site:   [email or web address] °

## 2011-09-10 MED ORDER — HYDROCODONE-ACETAMINOPHEN 5-325 MG PO TABS: 1.0000 | ORAL_TABLET | ORAL | Status: AC | PRN

## 2011-09-10 NOTE — Progress Notes (Signed)
Patient ID: Duane Lambert, male   DOB: 08/15/1960, 51 y.o.   MRN: [id number] 1 Day Post-Op  Subjective: Sore but no severe pain. He ate breakfast without difficulty.  Objective: Vital signs in last 24 hours: Temp:  [97.5 F (36.4 C)-98.5 F (36.9 C)] 98 F (36.7 C) (03/16 0548) Pulse Rate:  [60-77] 60  (03/16 0548) Resp:  [12-18] 18  (03/16 0548) BP: (111-137)/(69-87) 111/77 mmHg (03/16 0548) SpO2:  [97 %-99 %] 97 % (03/16 0548) Last BM Date: 09/08/11  Intake/Output from previous day: 03/15 0701 - 03/16 0700 In: 450 [I.V.:450] Out: 1975 [Urine:1975] Intake/Output this shift:    General appearance: alert and no distress GI: normal findings: soft, non-tender Incision/Wound: clean and dry without signs of infection  Lab Results:   Basename 09/09/11 1015 09/08/11 2218  WBC 12.0* 13.9*  HGB 13.4 14.4  HCT 38.7* 41.3  PLT 185 196   BMET  Basename 09/09/11 1015 09/08/11 2218  NA -- 135  K -- 3.5  CL -- 99  CO2 -- 24  GLUCOSE -- 121*  BUN -- 11  CREATININE 0.57 0.61  CALCIUM -- 9.4     Studies/Results: Ct Abdomen Pelvis W Contrast  09/09/2011  *RADIOLOGY REPORT*  Clinical Data: Diffuse abdominal  CT ABDOMEN AND PELVIS WITH CONTRAST  Technique:  Multidetector CT imaging of the abdomen and pelvis was performed following the standard protocol during bolus administration of intravenous contrast.  Contrast: 80mL OMNIPAQUE IOHEXOL 300 MG/ML IJ SOLN pain  Comparison: None.  Findings: Limited images through the lung bases demonstrate no significant appreciable abnormality. The heart size is within normal limits. No pleural or pericardial effusion.  Unremarkable liver, biliary system, spleen, pancreas, adrenal glands.  Symmetric renal enhancement.  No hydronephrosis or hydroureter.  The appendix is distended up to 1.1 cm.  There is mucosal hyperenhancement and mucosal edema.  Mild periappendiceal fat stranding.  No bowel obstruction.  No CT evidence for colitis.  No free  intraperitoneal air.  Decompressed bladder.  No lymphadenopathy.  There is scattered atherosclerotic calcification of the aorta and its branches. No aneurysmal dilatation.  Bilateral SI joint ankylosis.  No acute osseous abnormality.  IMPRESSION: Acute appendicitis.  Original Report Authenticated By: Duane Lambert, M.D.    Anti-infectives: Anti-infectives    None      Assessment/Plan: s/p Procedure(s): APPENDECTOMY LAPAROSCOPIC Doing well postoperatively without apparent complication. Okay for discharge.   LOS: 2 days    Duane Lambert T 09/10/2011

## 2011-09-10 NOTE — Discharge Summary (Signed)
   Patient ID: GLENDON DUNWOODY [id number] 50 y.o. 16-Nov-1960  09/08/2011  Discharge date and time: 09/10/2011   Admitting Physician: Jimmye Norman  Discharge Physician: Glenna Fellows T  Admission Diagnoses: Appendicitis [541] abd pain  Discharge Diagnoses: same  Operations: Procedure(s): APPENDECTOMY LAPAROSCOPIC  Admission Condition: fair  Discharged Condition: good  Indication for Admission: patient presented with acute lower abdominal pain and CT scan showing evidence of acute appendicitis.  Hospital Course: patient underwent uneventful laparoscopic appendectomy. On the following day he had some soreness but no severe pain and a benign exam. He was tolerating a diet. He is ready for discharge.   Disposition: Home  Patient Instructions:   Cederic, [place]ley  Home Medication Instructions [id number]   Printed on:09/10/11 1135  Medication Information                    HYDROcodone-acetaminophen (NORCO) 5-325 MG per tablet Take 1-2 tablets by mouth every 4 (four) hours as needed.             Activity: activity as tolerated and no driving while on analgesics Diet: regular diet Wound Care: none needed  Follow-up:  With Dr. Lindie Spruce in 2 weeks.  Signed: Mariella Saa MD, FACS  09/10/2011, 11:35 AM

## 2011-09-13 ENCOUNTER — Encounter (HOSPITAL_COMMUNITY): Payer: Self-pay | Admitting: General Surgery

## 2011-09-22 ENCOUNTER — Encounter (INDEPENDENT_AMBULATORY_CARE_PROVIDER_SITE_OTHER): Payer: Self-pay | Admitting: General Surgery

## 2011-09-22 ENCOUNTER — Ambulatory Visit (INDEPENDENT_AMBULATORY_CARE_PROVIDER_SITE_OTHER): Payer: Self-pay | Admitting: General Surgery

## 2011-09-22 ENCOUNTER — Encounter (INDEPENDENT_AMBULATORY_CARE_PROVIDER_SITE_OTHER): Payer: Self-pay

## 2011-09-22 VITALS — BP 114/72 | HR 68 | Ht 67.0 in | Wt 151.6 lb

## 2011-09-22 DIAGNOSIS — Z09 Encounter for follow-up examination after completed treatment for conditions other than malignant neoplasm: Secondary | ICD-10-CM

## 2011-09-22 NOTE — Progress Notes (Signed)
HPI The patient was doing well status post laparoscopic appendectomy however he is complaining of a knot at his umbilicus and his left lower quadrant.  PE On examination he has orthostatic subcutaneous organizing hematoma is an or scar tissue. It does not feel like a recurrent hernia.  Studiy review No studies to review.  Assessment Status post laparoscopic appendectomy  Plan Return to see me on a p.r.n. basis. However given the work release for October 10, 2011. At that time he will be under no restrictions.

## 2016-02-11 ENCOUNTER — Encounter (HOSPITAL_COMMUNITY): Payer: Self-pay | Admitting: Emergency Medicine

## 2016-02-11 ENCOUNTER — Emergency Department (HOSPITAL_COMMUNITY)
Admission: EM | Admit: 2016-02-11 | Discharge: 2016-02-11 | Disposition: A | Discharge status: Home / Self Care | Payer: No Typology Code available for payment source | Attending: Emergency Medicine | Admitting: Emergency Medicine

## 2016-02-11 DIAGNOSIS — S0990XA Unspecified injury of head, initial encounter: Secondary | ICD-10-CM | POA: Diagnosis not present

## 2016-02-11 DIAGNOSIS — F17200 Nicotine dependence, unspecified, uncomplicated: Secondary | ICD-10-CM | POA: Diagnosis not present

## 2016-02-11 DIAGNOSIS — Y939 Activity, unspecified: Secondary | ICD-10-CM | POA: Insufficient documentation

## 2016-02-11 DIAGNOSIS — Y92410 Unspecified street and highway as the place of occurrence of the external cause: Secondary | ICD-10-CM | POA: Insufficient documentation

## 2016-02-11 DIAGNOSIS — S199XXA Unspecified injury of neck, initial encounter: Secondary | ICD-10-CM | POA: Diagnosis present

## 2016-02-11 DIAGNOSIS — Y999 Unspecified external cause status: Secondary | ICD-10-CM | POA: Diagnosis not present

## 2016-02-11 DIAGNOSIS — S161XXA Strain of muscle, fascia and tendon at neck level, initial encounter: Secondary | ICD-10-CM | POA: Diagnosis not present

## 2016-02-11 MED ORDER — ACETAMINOPHEN 500 MG PO TABS
1000.0000 mg | ORAL_TABLET | Freq: Once | ORAL | Status: AC
  Administered 2016-02-11: 1000 mg via ORAL
  Filled 2016-02-11: qty 2
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
## 2016-02-11 NOTE — Discharge Instructions (Signed)
Take Tylenol as directed for pain. See an urgent care center if you continue to have significant discomfort in 5-7 days.
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
## 2017-07-25 ENCOUNTER — Emergency Department (HOSPITAL_COMMUNITY)
Admission: EM | Admit: 2017-07-25 | Discharge: 2017-07-25 | Disposition: A | Discharge status: Home / Self Care | Payer: Self-pay | Attending: Emergency Medicine | Admitting: Emergency Medicine

## 2017-07-25 ENCOUNTER — Encounter (HOSPITAL_COMMUNITY): Payer: Self-pay | Admitting: *Deleted

## 2017-07-25 DIAGNOSIS — F17200 Nicotine dependence, unspecified, uncomplicated: Secondary | ICD-10-CM | POA: Insufficient documentation

## 2017-07-25 DIAGNOSIS — K4090 Unilateral inguinal hernia, without obstruction or gangrene, not specified as recurrent: Secondary | ICD-10-CM | POA: Insufficient documentation

## 2017-07-25 LAB — CBC
HCT: 40.3 % (ref 39.0–52.0)
HEMOGLOBIN: 14.1 g/dL (ref 13.0–17.0)
MCH: 29.6 pg (ref 26.0–34.0)
MCHC: 35 g/dL (ref 30.0–36.0)
MCV: 84.5 fL (ref 78.0–100.0)
PLATELETS: 258 10*3/uL (ref 150–400)
RBC: 4.77 MIL/uL (ref 4.22–5.81)
RDW: 14.1 % (ref 11.5–15.5)
WBC: 9.9 10*3/uL (ref 4.0–10.5)

## 2017-07-25 LAB — COMPREHENSIVE METABOLIC PANEL
ALBUMIN: 3.7 g/dL (ref 3.5–5.0)
ALK PHOS: 77 U/L (ref 38–126)
ALT: 33 U/L (ref 17–63)
AST: 39 U/L (ref 15–41)
Anion gap: 7 (ref 5–15)
BILIRUBIN TOTAL: 0.7 mg/dL (ref 0.3–1.2)
BUN: 6 mg/dL (ref 6–20)
CALCIUM: 9 mg/dL (ref 8.9–10.3)
CO2: 22 mmol/L (ref 22–32)
CREATININE: 0.55 mg/dL — AB (ref 0.61–1.24)
Chloride: 103 mmol/L (ref 101–111)
GFR calc Af Amer: >60 mL/min (ref >60)
GFR calc non Af Amer: >60 mL/min (ref >60)
Glucose, Bld: 97 mg/dL (ref 65–99)
Potassium: 4.6 mmol/L (ref 3.5–5.1)
SODIUM: 132 mmol/L — AB (ref 135–145)
Total Protein: 7.8 g/dL (ref 6.5–8.1)

## 2017-07-25 LAB — URINALYSIS, ROUTINE W REFLEX MICROSCOPIC
Bilirubin Urine: NEGATIVE
Glucose, UA: NEGATIVE mg/dL
KETONES UR: NEGATIVE mg/dL
Nitrite: POSITIVE — AB
PH: 9 — AB (ref 5.0–8.0)
Protein, ur: 100 mg/dL — AB
Specific Gravity, Urine: 1.016 (ref 1.005–1.030)
Squamous Epithelial / LPF: NONE SEEN

## 2017-07-25 LAB — LIPASE, BLOOD: Lipase: 19 U/L (ref 11–51)

## 2017-07-25 MED ORDER — DOXYCYCLINE HYCLATE 100 MG PO CAPS: 100.0000 mg | ORAL_CAPSULE | Freq: Two times a day (BID) | ORAL | 0 refills | Status: DC

## 2017-07-25 MED ORDER — CEFTRIAXONE SODIUM 1 G IJ SOLR
1.0000 g | Freq: Once | INTRAMUSCULAR | Status: AC
Start: 2017-07-25 — End: 2017-07-25
  Administered 2017-07-25: 1 g via INTRAMUSCULAR
  Filled 2017-07-25: qty 10

## 2017-07-25 MED ORDER — HYDROCODONE-ACETAMINOPHEN 5-325 MG PO TABS: 2.0000 | ORAL_TABLET | ORAL | 0 refills | Status: DC | PRN

## 2017-07-25 MED ORDER — LIDOCAINE HCL (PF) 1 % IJ SOLN
2.0000 mL | Freq: Once | INTRAMUSCULAR | Status: AC
  Administered 2017-07-25: 2 mL
  Filled 2017-07-25: qty 30

## 2017-07-25 MED ORDER — AZITHROMYCIN 1 G PO PACK
1.0000 g | PACK | Freq: Once | ORAL | Status: AC
  Administered 2017-07-25: 1 g via ORAL
  Filled 2017-07-25: qty 1

## 2017-07-25 NOTE — ED Provider Notes (Signed)
[facility] DEPT Provider Note   CSN: [id number] Arrival date & time: 07/25/17  1437     History   Chief Complaint Chief Complaint  Patient presents with  . Hernia    HPI Duane Lambert is a 57 y.o. male.  57 year old male with prior history of appendectomy presents with complaint of right inguinal pain. Hhe reports pain to the right inguinal area for the last week.  Patient's pain is worse with standing.  He reports that the area "bulges" out with standing.  The "bulge" resolves with laying down.  He denies associated nausea or vomiting.  He denies fever.  Has prior history of hernia.  He denies any dysuria.  He does report that his urine today looked a "little bit red." He denies penile discharge or drainage. He denies known exposure to potential source of STD.   The history is provided by the patient.  Groin Pain  This is a new problem. The current episode started more than 2 days ago. The problem occurs daily. The problem has not changed since onset.Pertinent negatives include no chest pain, no abdominal pain, no headaches and no shortness of breath. The symptoms are aggravated by standing. The symptoms are relieved by position. He has tried nothing for the symptoms. The treatment provided mild relief.    History reviewed. No pertinent past medical history.  Patient Active Problem List   Diagnosis Date Noted  . Postop check 09/22/2011    Past Surgical History:  Procedure Laterality Date  . LAPAROSCOPIC APPENDECTOMY  09/09/2011   Procedure: APPENDECTOMY LAPAROSCOPIC;  Surgeon: Doreen Salvage, MD;  Location: [facility];  Service: General;  Laterality: N/A;       Home Medications    Prior to Admission medications   Medication Sig Start Date End Date Taking? Authorizing Provider  doxycycline (VIBRAMYCIN) 100 MG capsule Take 1 capsule (100 mg total) by mouth 2 (two) times daily. 07/25/17   Valarie Merino, MD  HYDROcodone-acetaminophen  (NORCO/VICODIN) 5-325 MG tablet Take 2 tablets by mouth every 4 (four) hours as needed. 07/25/17   Valarie Merino, MD    Family History No family history on file.  Social History Social History   Tobacco Use  . Smoking status: Current Every Day Smoker    Packs/day: 0.50  . Smokeless tobacco: Current User  Substance Use Topics  . Alcohol use: No  . Drug use: Not on file     Allergies   Patient has no known allergies.   Review of Systems Review of Systems  Respiratory: Negative for shortness of breath.   Cardiovascular: Negative for chest pain.  Gastrointestinal: Negative for abdominal pain.  Genitourinary:       Inguinal pain   Neurological: Negative for headaches.  All other systems reviewed and are negative.    Physical Exam Updated Vital Signs BP (!) 156/100 (BP Location: Left Arm)   Pulse 78   Temp 98.1 F (36.7 C) (Oral)   Resp 20   Ht 5\' 6"  (1.676 m)   Wt 59 kg (130 lb)   SpO2 100%   BMI 20.98 kg/m   Physical Exam  Constitutional: He is oriented to person, place, and time. He appears well-developed and well-nourished. No distress.  HENT:  Head: Normocephalic and atraumatic.  Mouth/Throat: Oropharynx is clear and moist.  Eyes: Conjunctivae and EOM are normal. Pupils are equal, round, and reactive to light.  Neck: Normal range of motion. Neck supple.  Cardiovascular: Normal rate, regular rhythm and  normal heart sounds.  Pulmonary/Chest: Effort normal and breath sounds normal. No respiratory distress.  Abdominal: Soft. He exhibits no distension. There is no tenderness. A hernia is present.  Right inguinal hernia is present.  Hernia is easily reducible.  No evidence of incarceration.  Abdominal exam is otherwise normal without signs of peritonitis.  Genitourinary: Penis normal. No penile tenderness.  Genitourinary Comments: No penile discharge  Normal scrotal exam without mass or tenderness.   Musculoskeletal: Normal range of motion. He exhibits no  edema or deformity.  Neurological: He is alert and oriented to person, place, and time.  Skin: Skin is warm and dry.  Psychiatric: He has a normal mood and affect.  Nursing note and vitals reviewed.    ED Treatments / Results  Labs (all labs ordered are listed, but only abnormal results are displayed) Labs Reviewed  COMPREHENSIVE METABOLIC PANEL - Abnormal; Notable for the following components:      Result Value   Sodium 132 (*)    Creatinine, Ser 0.55 (*)    All other components within normal limits  URINALYSIS, ROUTINE W REFLEX MICROSCOPIC - Abnormal; Notable for the following components:   APPearance CLOUDY (*)    pH 9.0 (*)    Hgb urine dipstick MODERATE (*)    Protein, ur 100 (*)    Nitrite POSITIVE (*)    Leukocytes, UA LARGE (*)    Bacteria, UA MANY (*)    All other components within normal limits  LIPASE, BLOOD  CBC    EKG  EKG Interpretation None       Radiology No results found.  Procedures Procedures (including critical care time)  Medications Ordered in ED Medications  cefTRIAXone (ROCEPHIN) injection 1 g (not administered)  azithromycin (ZITHROMAX) powder 1 g (not administered)  lidocaine (PF) (XYLOCAINE) 1 % injection 2 mL (not administered)     Initial Impression / Assessment and Plan / ED Course  I have reviewed the triage vital signs and the nursing notes.  Pertinent labs & imaging results that were available during my care of the patient were reviewed by me and considered in my medical decision making (see chart for details).     MDM   Screen complete  Presentation is consistent with right inguinal hernia.  There is no evidence of incarceration on exam.  Patient understands need for close follow-up with a surgeon to obtain permanent repair.  Patient understands need to return for any signs of incarceration. UA suggests possibility of UTI/STD - will treat presumptively for both.  Close follow-up is advised.  Strict return precautions  given and understood.  Final Clinical Impressions(s) / ED Diagnoses   Final diagnoses:  Unilateral inguinal hernia without obstruction or gangrene, recurrence not specified    ED Discharge Orders        Ordered    HYDROcodone-acetaminophen (NORCO/VICODIN) 5-325 MG tablet  Every 4 hours PRN     07/25/17 1936    doxycycline (VIBRAMYCIN) 100 MG capsule  2 times daily     07/25/17 1936       Valarie Merino, MD 07/25/17 1942

## 2017-07-25 NOTE — ED Triage Notes (Signed)
Pt BIB [facility] c/o R inguinal pain ongoing x1 week that is increasingly worsening. Worsens with movement. He states that a "lump" comes and goes in that area. Ambulatory.

## 2017-07-26 LAB — GC/CHLAMYDIA PROBE AMP (~~LOC~~) NOT AT ARMC
Chlamydia: NEGATIVE
Neisseria Gonorrhea: NEGATIVE

## 2017-11-24 ENCOUNTER — Other Ambulatory Visit: Payer: Self-pay

## 2017-11-24 ENCOUNTER — Encounter (HOSPITAL_COMMUNITY): Payer: Self-pay | Admitting: Emergency Medicine

## 2017-11-24 ENCOUNTER — Emergency Department (HOSPITAL_COMMUNITY): Payer: Self-pay

## 2017-11-24 ENCOUNTER — Emergency Department (HOSPITAL_COMMUNITY)
Admission: EM | Admit: 2017-11-24 | Discharge: 2017-11-25 | Disposition: A | Discharge status: Home / Self Care | Payer: Self-pay | Attending: Emergency Medicine | Admitting: Emergency Medicine

## 2017-11-24 DIAGNOSIS — Y929 Unspecified place or not applicable: Secondary | ICD-10-CM | POA: Insufficient documentation

## 2017-11-24 DIAGNOSIS — Z23 Encounter for immunization: Secondary | ICD-10-CM | POA: Insufficient documentation

## 2017-11-24 DIAGNOSIS — S61012A Laceration without foreign body of left thumb without damage to nail, initial encounter: Secondary | ICD-10-CM | POA: Insufficient documentation

## 2017-11-24 DIAGNOSIS — S61412A Laceration without foreign body of left hand, initial encounter: Secondary | ICD-10-CM | POA: Insufficient documentation

## 2017-11-24 DIAGNOSIS — F17200 Nicotine dependence, unspecified, uncomplicated: Secondary | ICD-10-CM | POA: Insufficient documentation

## 2017-11-24 DIAGNOSIS — Z79899 Other long term (current) drug therapy: Secondary | ICD-10-CM | POA: Insufficient documentation

## 2017-11-24 DIAGNOSIS — Y998 Other external cause status: Secondary | ICD-10-CM | POA: Insufficient documentation

## 2017-11-24 DIAGNOSIS — Y939 Activity, unspecified: Secondary | ICD-10-CM | POA: Insufficient documentation

## 2017-11-24 MED ORDER — TETANUS-DIPHTH-ACELL PERTUSSIS 5-2.5-18.5 LF-MCG/0.5 IM SUSP
0.5000 mL | Freq: Once | INTRAMUSCULAR | Status: AC
  Administered 2017-11-24: 0.5 mL via INTRAMUSCULAR
  Filled 2017-11-24: qty 0.5

## 2017-11-24 MED ORDER — LIDOCAINE HCL 2 % IJ SOLN
20.0000 mL | Freq: Once | INTRAMUSCULAR | Status: AC
  Administered 2017-11-24: 400 mg
  Filled 2017-11-24: qty 20

## 2017-11-24 MED ORDER — HYDROCODONE-ACETAMINOPHEN 5-325 MG PO TABS
1.0000 | ORAL_TABLET | Freq: Once | ORAL | Status: AC
  Administered 2017-11-24: 1 via ORAL
  Filled 2017-11-24: qty 1

## 2017-11-24 MED ORDER — BACITRACIN ZINC 500 UNIT/GM EX OINT
TOPICAL_OINTMENT | Freq: Once | CUTANEOUS | Status: AC
  Administered 2017-11-24: 1 via TOPICAL
  Filled 2017-11-24: qty 0.9
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
## 2017-11-24 NOTE — ED Notes (Signed)
PA at bedside, delayed in blood work.
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
## 2017-11-25 MED ORDER — NAPROXEN 500 MG PO TABS: 500.0000 mg | ORAL_TABLET | Freq: Two times a day (BID) | ORAL | 0 refills | Status: DC | PRN

## 2017-11-25 NOTE — Discharge Instructions (Addendum)
It was my pleasure taking care of you today!   Keep wound clean with mild soap and water. Keep area covered with a topical antibiotic ointment and bandage, keep bandage dry, and do not submerge in water for 24 hours. Ice and elevate for additional pain relief and swelling. Alternate between ibuprofen and Tylenol for additional pain relief. Follow up with your primary care doctor, [facility] or ER in approximately 10 days for wound recheck and suture removal. Monitor area for signs of infection to include, but not limited to: increasing pain, spreading redness, drainage/pus, worsening swelling, or fevers. Return to emergency department for emergent changing or worsening symptoms.   WOUND CARE Keep area clean and dry for 24 hours. Do not remove bandage, if applied. After 24 hours,you should change it at least once a day. Also, change the dressing if it becomes wet or dirty, or as directed by your caregiver.  Wash the wound with soap and water 2 times a day. Rinse the wound off with water to remove all soap. Pat the wound dry with a clean towel.  You may shower as usual after the first 24 hours. Do not soak the wound in water until the sutures are removed.  Return if you experience any of the following signs of infection: Swelling, redness, pus drainage, streaking, fever >101.0 F Return if you experience excessive bleeding that does not stop after 15-20 minutes of constant, firm pressure.

## 2017-12-05 ENCOUNTER — Emergency Department (HOSPITAL_COMMUNITY)
Admission: EM | Admit: 2017-12-05 | Discharge: 2017-12-05 | Disposition: A | Discharge status: Home / Self Care | Payer: Self-pay | Attending: Emergency Medicine | Admitting: Emergency Medicine

## 2017-12-05 ENCOUNTER — Other Ambulatory Visit: Payer: Self-pay

## 2017-12-05 ENCOUNTER — Encounter (HOSPITAL_COMMUNITY): Payer: Self-pay

## 2017-12-05 DIAGNOSIS — Z4802 Encounter for removal of sutures: Secondary | ICD-10-CM | POA: Insufficient documentation

## 2017-12-05 DIAGNOSIS — Z79899 Other long term (current) drug therapy: Secondary | ICD-10-CM | POA: Insufficient documentation

## 2017-12-05 DIAGNOSIS — Z5189 Encounter for other specified aftercare: Secondary | ICD-10-CM

## 2017-12-05 DIAGNOSIS — F17210 Nicotine dependence, cigarettes, uncomplicated: Secondary | ICD-10-CM | POA: Insufficient documentation

## 2017-12-05 MED ORDER — BACITRACIN ZINC 500 UNIT/GM EX OINT
TOPICAL_OINTMENT | Freq: Once | CUTANEOUS | Status: AC
  Administered 2017-12-05: 1 via TOPICAL
  Filled 2017-12-05: qty 0.9
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
## 2017-12-05 NOTE — Discharge Instructions (Addendum)
Clean the wound daily. If you are working or where the wound could get dirty keep it covered. Leave it open when you are at home just sitting around.

## 2017-12-05 NOTE — ED Triage Notes (Signed)
Pt states that he is here for a recheck to get his stitches out of his left hand

## 2019-08-05 IMAGING — CR DG HAND COMPLETE 3+V*L*
3 series · 3 of 3 positions shown · non-contrast
Comparison: None.

CLINICAL DATA: The patient suffered a laceration of the palm of the
left hand today. Initial encounter.

EXAM:
LEFT HAND - COMPLETE 3+ VIEW

[x hand pa left]
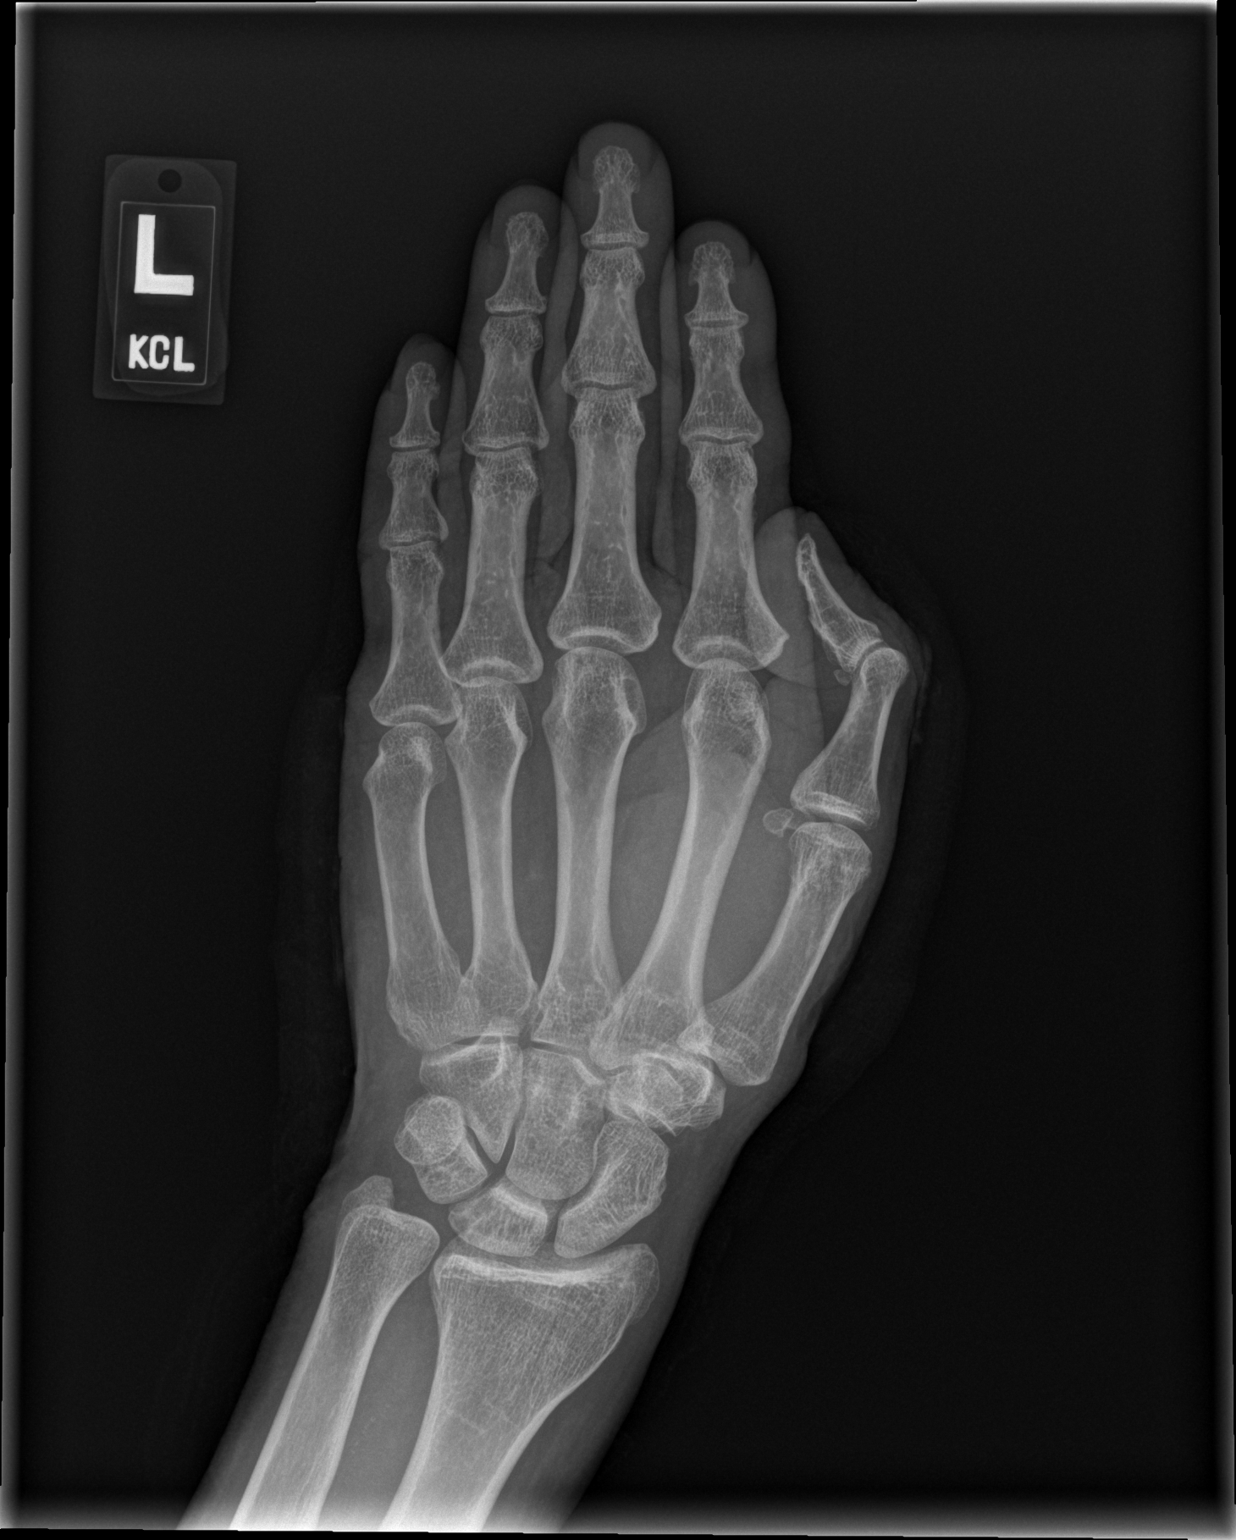

[x hand obl left]
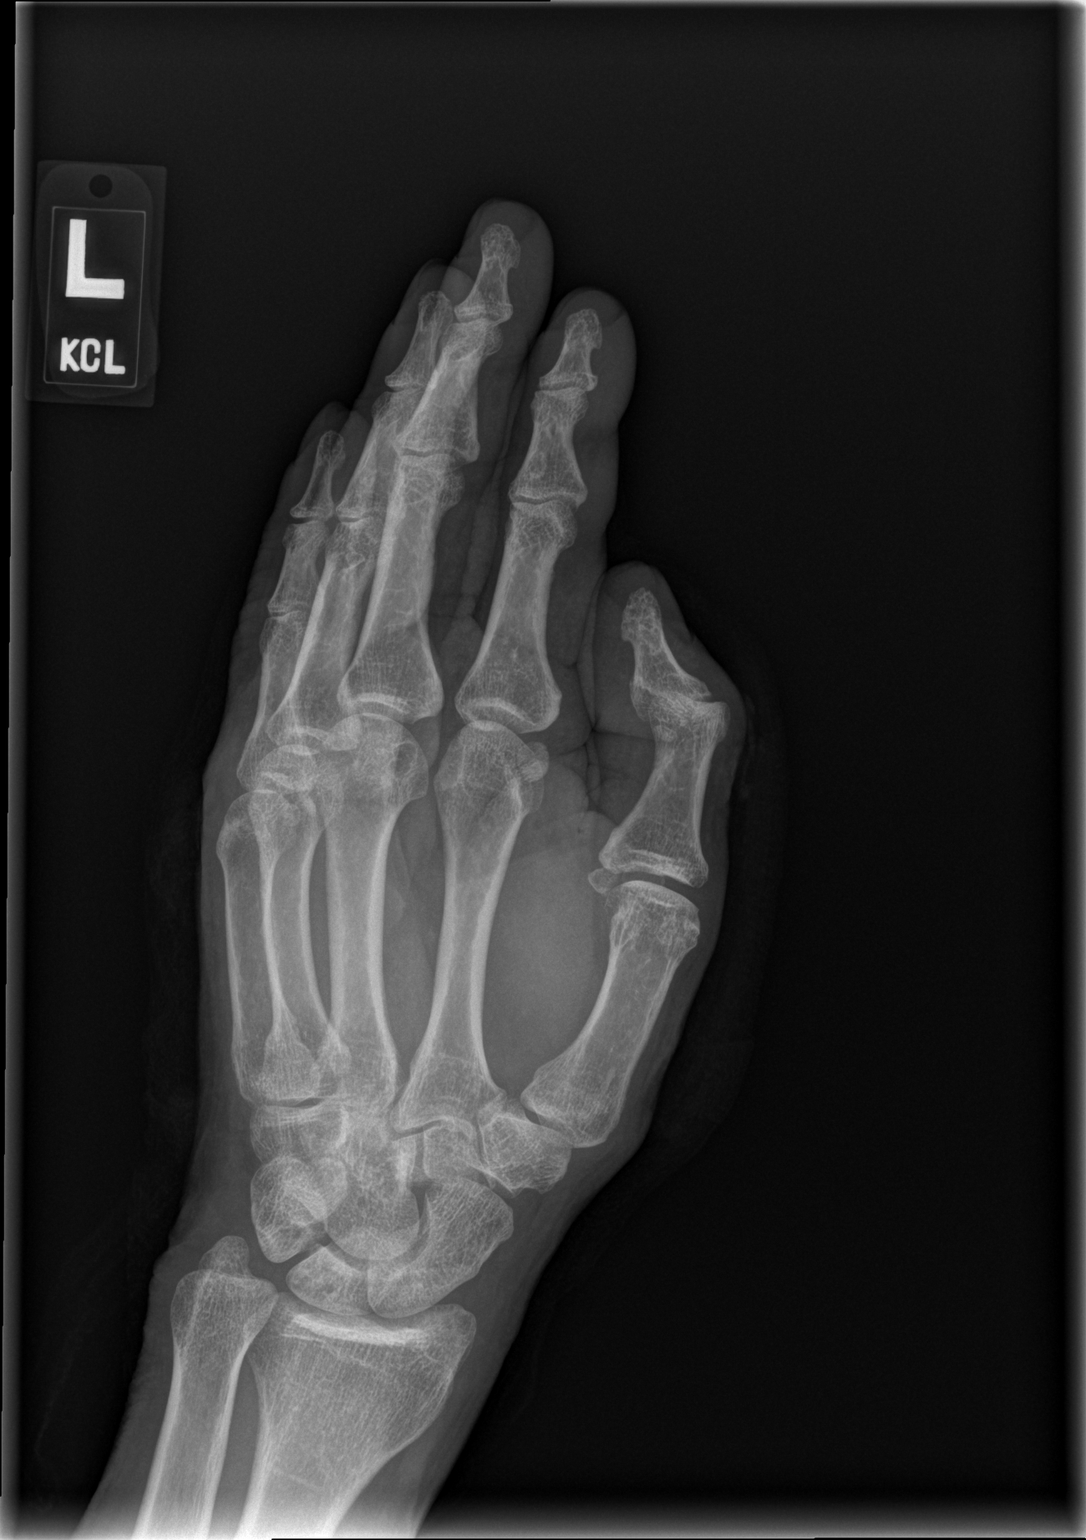

[x hand lat left]
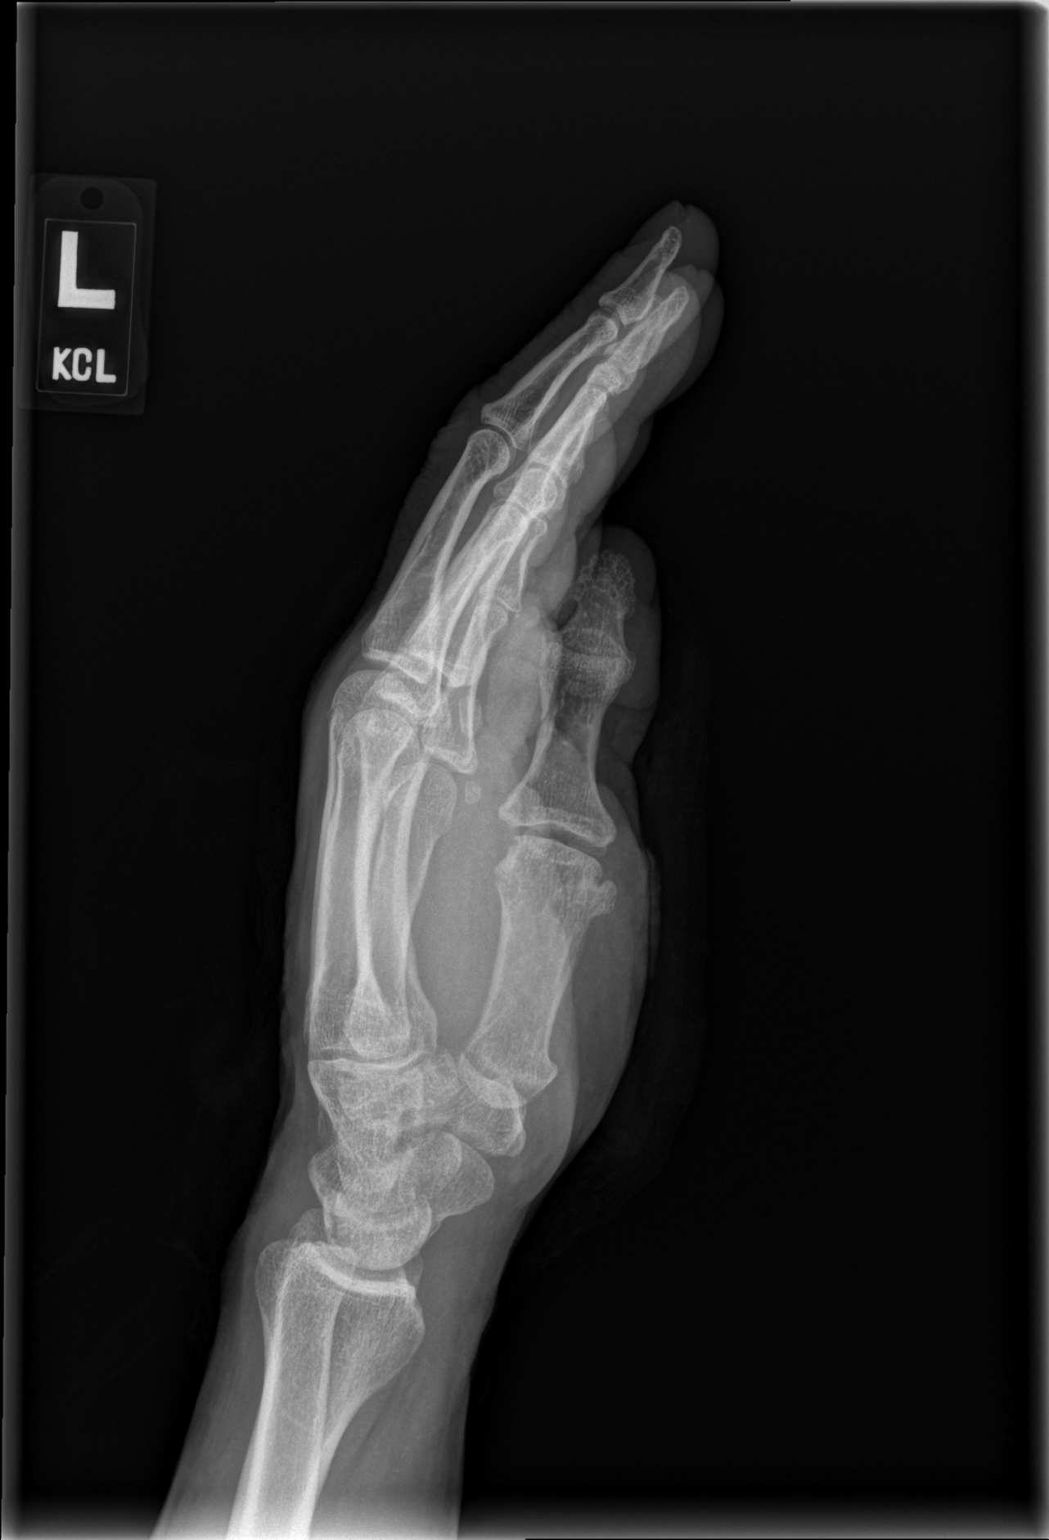

[3 of 3 positions shown; findings below may reference images not displayed]

FINDINGS: No acute bony or joint abnormality is seen. No radiopaque foreign
body is identified. No soft tissue gas.
IMPRESSION: Negative exam.

## 2019-12-04 ENCOUNTER — Other Ambulatory Visit: Payer: Self-pay

## 2019-12-04 ENCOUNTER — Emergency Department (HOSPITAL_COMMUNITY)
Admission: EM | Admit: 2019-12-04 | Discharge: 2019-12-04 | Disposition: A | Discharge status: Home / Self Care | Payer: No Typology Code available for payment source | Attending: Emergency Medicine | Admitting: Emergency Medicine

## 2019-12-04 DIAGNOSIS — X500XXA Overexertion from strenuous movement or load, initial encounter: Secondary | ICD-10-CM | POA: Diagnosis not present

## 2019-12-04 DIAGNOSIS — F17210 Nicotine dependence, cigarettes, uncomplicated: Secondary | ICD-10-CM | POA: Insufficient documentation

## 2019-12-04 DIAGNOSIS — Y9389 Activity, other specified: Secondary | ICD-10-CM | POA: Insufficient documentation

## 2019-12-04 DIAGNOSIS — Y9289 Other specified places as the place of occurrence of the external cause: Secondary | ICD-10-CM | POA: Insufficient documentation

## 2019-12-04 DIAGNOSIS — Y999 Unspecified external cause status: Secondary | ICD-10-CM | POA: Diagnosis not present

## 2019-12-04 DIAGNOSIS — S39012A Strain of muscle, fascia and tendon of lower back, initial encounter: Secondary | ICD-10-CM | POA: Insufficient documentation

## 2019-12-04 DIAGNOSIS — S3992XA Unspecified injury of lower back, initial encounter: Secondary | ICD-10-CM | POA: Diagnosis present

## 2019-12-04 MED ORDER — METHOCARBAMOL 500 MG PO TABS
500.0000 mg | ORAL_TABLET | Freq: Once | ORAL | Status: AC
Start: 2019-12-04 — End: 2019-12-04
  Administered 2019-12-04: 500 mg via ORAL
  Filled 2019-12-04: qty 1

## 2019-12-04 MED ORDER — KETOROLAC TROMETHAMINE 60 MG/2ML IM SOLN
60.0000 mg | Freq: Once | INTRAMUSCULAR | Status: AC
  Administered 2019-12-04: 60 mg via INTRAMUSCULAR
  Filled 2019-12-04: qty 2

## 2019-12-04 MED ORDER — NAPROXEN 500 MG PO TABS: 500.0000 mg | ORAL_TABLET | Freq: Two times a day (BID) | ORAL | 0 refills | Status: AC

## 2019-12-04 MED ORDER — METHYLPREDNISOLONE 4 MG PO TBPK: ORAL_TABLET | ORAL | 0 refills | Status: DC

## 2019-12-04 MED ORDER — METHOCARBAMOL 500 MG PO TABS: 500.0000 mg | ORAL_TABLET | Freq: Two times a day (BID) | ORAL | 0 refills | Status: DC

## 2019-12-04 NOTE — ED Provider Notes (Signed)
Back pain [facility] EMERGENCY DEPARTMENT Provider Note   CSN: [id number] Arrival date & time: 12/04/19  1128     History No chief complaint on file.   Duane Lambert is a 59 y.o. male.  HPI 59 year old male with no significant medical history presents to the ER with which occurred earlier today while he was lifting some heavy trash bags at work.  Patient states that he felt some sudden severe onset of left-sided back pain that is shooting down his leg.  He states that he cannot walk secondary to the pain.  He has not tried anything for his symptoms, was taken to the ER by his supervisor.  Denies any numbness, weakness, groin numbness, bowel and bladder incontinence.  No falls or injuries.  Pain is located to the left side of his back and shoots down the front of his left leg.  No fevers, chest pain, shortness of breath, chills, history of IV drug use, night sweats, unintended weight loss.    No past medical history on file.  Patient Active Problem List   Diagnosis Date Noted  . Postop check 09/22/2011    Past Surgical History:  Procedure Laterality Date  . LAPAROSCOPIC APPENDECTOMY  09/09/2011   Procedure: APPENDECTOMY LAPAROSCOPIC;  Surgeon: Doreen Salvage, MD;  Location: [facility];  Service: General;  Laterality: N/A;       No family history on file.  Social History   Tobacco Use  . Smoking status: Current Every Day Smoker    Packs/day: 0.50  . Smokeless tobacco: Current User  Substance Use Topics  . Alcohol use: No  . Drug use: Not on file    Home Medications Prior to Admission medications   Medication Sig Start Date End Date Taking? Authorizing Provider  doxycycline (VIBRAMYCIN) 100 MG capsule Take 1 capsule (100 mg total) by mouth 2 (two) times daily. Patient not taking: Reported on 12/05/2017 07/25/17   Valarie Merino, MD  HYDROcodone-acetaminophen (NORCO/VICODIN) 5-325 MG tablet Take 2 tablets by mouth every 4 (four) hours as needed. Patient not  taking: Reported on 12/05/2017 07/25/17   Valarie Merino, MD  methocarbamol (ROBAXIN) 500 MG tablet Take 1 tablet (500 mg total) by mouth 2 (two) times daily. 12/04/19   Garald Balding, PA-C  methylPREDNISolone (MEDROL DOSEPAK) 4 MG TBPK tablet Take as directed on package 12/04/19   Garald Balding, PA-C  naproxen (NAPROSYN) 500 MG tablet Take 1 tablet (500 mg total) by mouth 2 (two) times daily for 5 days. 12/04/19 12/09/19  Garald Balding, PA-C    Allergies    Patient has no known allergies.  Review of Systems   Review of Systems  Constitutional: Negative for chills and fever.  Musculoskeletal: Positive for back pain and gait problem.  Skin: Negative for rash.  Neurological: Negative for dizziness, weakness and numbness.    Physical Exam Updated Vital Signs BP (!) 147/92 (BP Location: Right Arm)   Pulse 87   Temp 97.8 F (36.6 C) (Oral)   Resp 16   Ht 5\' 6"  (1.676 m)   Wt 63.5 kg   SpO2 99%   BMI 22.60 kg/m   Physical Exam Vitals and nursing note reviewed.  Constitutional:      General: He is not in acute distress.    Appearance: He is well-developed and normal weight. He is not ill-appearing, toxic-appearing or diaphoretic.     Comments: Appears in pain  HENT:     Head: Normocephalic and atraumatic.  Mouth/Throat:     Mouth: Mucous membranes are moist.  Eyes:     Conjunctiva/sclera: Conjunctivae normal.  Cardiovascular:     Rate and Rhythm: Normal rate and regular rhythm.     Heart sounds: No murmur.  Pulmonary:     Effort: Pulmonary effort is normal. No respiratory distress.     Breath sounds: Normal breath sounds.  Abdominal:     General: Abdomen is flat.     Palpations: Abdomen is soft.     Tenderness: There is no abdominal tenderness.  Musculoskeletal:        General: Tenderness present. No swelling or deformity.     Cervical back: Normal range of motion and neck supple.     Right lower leg: No edema.     Left lower leg: No edema.     Comments: 5/5  strength in lower extremities bilaterally.  5/5 strength in plantar flexion, dorsi flexion, slightly limited range of motion of left leg secondary to pain.  No midline tenderness in C, T, L-spine.  Paraspinal lumbar muscle tenderness with no noticeable step-offs, crepitus, rashes, fluctuance.  Sensations intact.  Patient is able to ambulate in the ED but with pain.  2+ DP pulses  Skin:    General: Skin is warm and dry.     Capillary Refill: Capillary refill takes less than 2 seconds.     Coloration: Skin is jaundiced.     Findings: No bruising or erythema.  Neurological:     General: No focal deficit present.     Mental Status: He is alert and oriented to person, place, and time.     Sensory: No sensory deficit.     Motor: No weakness.  Psychiatric:        Mood and Affect: Mood normal.        Behavior: Behavior normal.     ED Results / Procedures / Treatments   Labs (all labs ordered are listed, but only abnormal results are displayed) Labs Reviewed - No data to display  EKG None  Radiology No results found.  Procedures Procedures (including critical care time)  Medications Ordered in ED Medications  ketorolac (TORADOL) injection 60 mg (60 mg Intramuscular Given 12/04/19 1352)  methocarbamol (ROBAXIN) tablet 500 mg (500 mg Oral Given 12/04/19 1352)    ED Course  I have reviewed the triage vital signs and the nursing notes.  Pertinent labs & imaging results that were available during my care of the patient were reviewed by me and considered in my medical decision making (see chart for details).    MDM Rules/Calculators/A&P                     Patient with back pain.  No neurological deficits and normal neuro exam.  Patient can walk but states is painful.  No loss of bowel or bladder control.  No concern for cauda equina.  No fever, night sweats, weight loss, h/o cancer, IVDU.  RICE protocol and pain medicine indicated and discussed with patient.  Received Toradol and Robaxin  in the ER.  Will DC with short course of naproxen, Medrol Dosepak, and Robaxin.  Patient coached on drowsy side effects of this medication and do not drink or drive while taking this.  Patient does not have a PCP, encouraged follow-up with the phone number on the discharge paperwork to establish with [facility] or community health and wellness.  Patient voices understanding is agreeable to this plan.  At this stage in the  ED course, the patient is medically screened and is stable for discharge.   Final Clinical Impression(s) / ED Diagnoses Final diagnoses:  Back strain, initial encounter    Rx / DC Orders ED Discharge Orders         Ordered    methocarbamol (ROBAXIN) 500 MG tablet  2 times daily     12/04/19 1351    naproxen (NAPROSYN) 500 MG tablet  2 times daily     12/04/19 1351    methylPREDNISolone (MEDROL DOSEPAK) 4 MG TBPK tablet     12/04/19 1351           Garald Balding, PA-C 12/04/19 1403    Sherwood Gambler, MD 12/04/19 1542

## 2019-12-04 NOTE — Discharge Instructions (Addendum)
Your back pain is likely due to a musculoskeletal strain.  I have prescribed a muscle relaxer called Robaxin, please take this medication at night and do not drink or drive on this medication as it can make you sleepy.  I have also provided you a short course of anti-inflammatories called naproxen.  Please take this medication for 5 days, but do not take more than prescribed as this medication can cause kidney problems and stomach bleeds.  I have also provided a short course of steroids to calm down the inflammation in your back.  There are side effects of steroids which include headache, nausea, vomiting, trouble sleeping, immunosuppression.  Return to the ER if you have worsening back pain, increasing numbness and weakness, suddenly inability to control your bowel or bladder.  I have provided a handout for gentle back stretches which should help.  You may also use heat/ice over the affected area.  If your symptoms do not improve, please establish with a primary care provider.  There is a phone number on your discharge paperwork that you can call to establish with one.  If you cannot afford 1, I provided the referral for [facility] which is a free clinic in the [place] area.

## 2019-12-04 NOTE — ED Triage Notes (Signed)
Pt here for eval of lower back pain with radiation down L leg that occurred while pulling a trash bag out of the trash can at work today. Took two Aleve PTA.

## 2020-01-13 ENCOUNTER — Ambulatory Visit (HOSPITAL_COMMUNITY): Payer: No Typology Code available for payment source

## 2020-01-15 ENCOUNTER — Other Ambulatory Visit (HOSPITAL_COMMUNITY): Payer: Self-pay | Admitting: Orthopedic Surgery

## 2020-01-15 DIAGNOSIS — M79604 Pain in right leg: Secondary | ICD-10-CM

## 2020-01-15 DIAGNOSIS — M79605 Pain in left leg: Secondary | ICD-10-CM

## 2020-01-15 DIAGNOSIS — M79661 Pain in right lower leg: Secondary | ICD-10-CM

## 2020-01-15 DIAGNOSIS — M79662 Pain in left lower leg: Secondary | ICD-10-CM

## 2020-01-16 ENCOUNTER — Ambulatory Visit (HOSPITAL_COMMUNITY)
Admission: RE | Admit: 2020-01-16 | Discharge: 2020-01-16 | Disposition: A | Discharge status: Home / Self Care | Payer: No Typology Code available for payment source | Source: Ambulatory Visit | Attending: Cardiology | Admitting: Cardiology

## 2020-01-16 ENCOUNTER — Other Ambulatory Visit: Payer: Self-pay

## 2020-01-16 ENCOUNTER — Encounter: Payer: Self-pay | Admitting: *Deleted

## 2020-01-16 DIAGNOSIS — M79662 Pain in left lower leg: Secondary | ICD-10-CM

## 2020-01-16 DIAGNOSIS — M79605 Pain in left leg: Secondary | ICD-10-CM | POA: Diagnosis present

## 2020-01-16 DIAGNOSIS — M79604 Pain in right leg: Secondary | ICD-10-CM | POA: Diagnosis not present

## 2020-01-16 DIAGNOSIS — M79661 Pain in right lower leg: Secondary | ICD-10-CM | POA: Diagnosis not present

## 2020-03-30 ENCOUNTER — Ambulatory Visit: Payer: Self-pay | Admitting: Orthopedic Surgery

## 2020-04-10 ENCOUNTER — Ambulatory Visit: Payer: Self-pay | Admitting: Orthopedic Surgery

## 2020-04-10 NOTE — H&P (Deleted)
  The note originally documented on this encounter has been moved the the encounter in which it belongs.  

## 2020-04-10 NOTE — H&P (Signed)
Subjective:   Duane Lambert is a pleasant 59 year old male who was in his noormal state of health until unfortunately he was involved in a Worker's Comp. injury, his initial evaluation was with Dr. Rolena Infante on 12/13/2019 and his complaint is low back pain with severe radicular left leg pain. This has been ongoing despite appropriate conservative care. He has been unable to tolerate formalized physical therapy, he did get significant, however temporary relief from an L4 selective nerve root block. Given that this pain is impacting his quality-of-life and he is failed to improve with appropriate conservative care, he has elected to move forward with surgical intervention. He is scheduled for TLIF L4-5 @ [facility] on 04/22/20 with Dr. Rolena Infante.  Patient Active Problem List   Diagnosis Date Noted  . Postop check 09/22/2011   No past medical history on file.  Past Surgical History:  Procedure Laterality Date  . LAPAROSCOPIC APPENDECTOMY  09/09/2011   Procedure: APPENDECTOMY LAPAROSCOPIC;  Surgeon: Doreen Salvage, MD;  Location: [facility];  Service: General;  Laterality: N/A;    Current Outpatient Medications  Medication Sig Dispense Refill Last Dose  . gabapentin (NEURONTIN) 300 MG capsule Take 300 mg by mouth daily as needed (pain).     Marland Kitchen lisinopril (ZESTRIL) 20 MG tablet Take 20 mg by mouth daily.     . methocarbamol (ROBAXIN) 500 MG tablet Take 1 tablet (500 mg total) by mouth 2 (two) times daily. (Patient taking differently: Take 500 mg by mouth 3 (three) times daily as needed for muscle spasms. ) 20 tablet 0   . methylPREDNISolone (MEDROL DOSEPAK) 4 MG TBPK tablet Take as directed on package (Patient not taking: Reported on 04/09/2020) 1 each 0   . traMADol (ULTRAM) 50 MG tablet Take 50 mg by mouth 3 (three) times daily as needed for moderate pain.      No current facility-administered medications for this visit.   No Known Allergies  Social History   Tobacco Use  . Smoking status: Current Every Day Smoker     Packs/day: 0.50  . Smokeless tobacco: Current User  Substance Use Topics  . Alcohol use: No    No family history on file.  Review of Systems As stated in HPI  Objective:   Vitals: Ht: 5 ft 5 in 04/10/2020 03:21 pm Wt: 150 lbs 04/10/2020 03:21 pm BMI: 25 04/10/2020 03:21 pm BP: 118/80 04/10/2020 03:25 pm Pulse: 70 bpm 04/10/2020 03:23 pm   Clinical exam: Patient is alert and oriented 3. No shortness of breath, chest pain.  Heart: RRR, no rubs, murmers, or gallops  Lungs: CTAB  Abdomen: Soft and nontender. No loss of bowel and bladder control, no rebound tenderness. BS x4.  Lumbar spine: Ongoing severe low back pain radiating into the left L4 dermatome. Patient noted 24 hours of significant improvement (75%) following the left L4 selective nerve root block.  Neuro: 5/5 motor strength in lower extremity. Severe radicular leg pain and dysesthesias into the left L4 dermatome. Reflexes: Babinski: Negative  Lumbar MRI: completed on 12/13/19 was re-reviewed. L2-3: Spondylitic osteophyte causing lateral recess and foraminal disease moderate on the left less impressive on the right. Facet arthropathy and moderate foraminal narrowing at L3-4. Grade 1 degenerative spondylolisthesis producing moderate foraminal and central stenosis at L4-5. Moderate foraminal narrowing at L5-S1. No abnormal marrow signal changes, no fracture seen.  Assessment:   Duane Lambert returns today for follow-up with ongoing severe debilitating pain. At this point he is been unable to tolerate physical therapy and his radicular leg pain  is been consistent and debilitating. He did get 75% improvement with the recent L4 selective nerve root block indicating this most likely his primary pain source. His MRI does show a small foraminal disc which could be contributing to the nerve irritation of severe pain. In order to address this more than likely he would need a complete foraminal decompression which could produce instability.  Therefore I am recommending a TLIF on the left side of L4-5. This allows for complete decompression of the L4 nerve root and stabilization to prevent iatrogenic instability.  Plan:   L4-5 transforaminal lumbar interbody fusion  I have gone over the risks and benefits of surgery with the patient and his sister-in-law in great detail. All of their questions were encouraged and addressed. I indicated there is a 70% success rate which would mean there is a 30% chance that he is no better or worse. No guarantee could be provided concerning the outcome of surgery.  Risks and benefits of spinal fusion: Infection, bleeding, death, stroke, paralysis, ongoing or worse pain, need for additional surgery, nonunion, leak of spinal fluid, adjacent segment degeneration requiring additional fusion surgery, Injury to abdominal vessels that can require anterior surgery to stop bleeding. Malposition of the cage and/or pedicle screws that could require additional surgery. Loss of bowel and bladder control. Postoperative hematoma causing neurologic compression that could require urgent or emergent re-operation.  Risks and benefits of decompression/discectomy: Infection, bleeding, death, stroke, paralysis, ongoing or worse pain, need for additional surgery, leak of spinal fluid, adjacent segment degeneration requiring additional surgery, post-operative hematoma formation that can result in neurological compromise and the need for urgent/emergent re-operation. Loss in bowel and bladder control. Injury to major vessels that could result in the need for urgent abdominal surgery to stop bleeding. Risk of deep venous thrombosis (DVT) and the need for additional treatment. Recurrent disc herniation resulting in the need for revision surgery, which could include fusion surgery (utilizing instrumentation such as pedicle screws and intervertebral cages).  Additional risk: If instrumentation is used there is a risk of migration, or  breakage of that hardware that could require additional surgery.  We have obtained preoperative medical clearance from an urgent care center, the patient has been started on lisinopril which seems to be managing his blood pressure well, he will follow up at the urgent care for a repeat blood pressure evaluation and to get a refill of his lisinopril.  I reviewed the patient's medication list. He is not on any blood thinners, not using any aspirin products, and I have advised him to discontinue the use of any anti-inflammatory products. He may use Tylenol as needed for pain.  We have also discussed the post-operative recovery period to include: bathing/showering restrictions, wound healing, activity (and driving) restrictions, medications/pain mangement.  We have also discussed post-operative redflags to include: signs and symptoms of postoperative infection, DVT/PE.  Patient's sister-in-law was available the of phone call. Both the patient and the patient's sister-in-law were invited to ask any questions and all of her questions were answered.  Patient has an appointment sccheduled to be fitted for an LSO brace with physical therapy.  Follow-up: 2 weeks postop

## 2020-04-16 ENCOUNTER — Ambulatory Visit (HOSPITAL_COMMUNITY): Payer: No Typology Code available for payment source | Admitting: Certified Registered Nurse Anesthetist

## 2020-04-16 ENCOUNTER — Ambulatory Visit (HOSPITAL_COMMUNITY): Payer: No Typology Code available for payment source | Admitting: Vascular Surgery

## 2020-04-16 NOTE — Progress Notes (Signed)
Your procedure is scheduled on Wednesday, April 22, 2020.  Report to [facility] Main Entrance "A" at 10:00 A.M., and check in at the Admitting office.  Call this number if you have problems the morning of surgery:  [phone]  Call [phone] if you have any questions prior to your surgery date Monday-Friday 8am-4pm    Remember:  Do not eat or drink after midnight the night before your surgery    Take these medicines the morning of surgery with A SIP OF WATER:  IF NEEDED: gabapentin (NEURONTIN) methocarbamol (ROBAXIN) traMADol (ULTRAM)  As of today, STOP taking any Aspirin (unless otherwise instructed by your surgeon) Aleve, Naproxen, Ibuprofen, Motrin, Advil, Goody's, BC's, all herbal medications, fish oil, and all vitamins.                      Do not wear jewelry.            Do not wear lotions, powders, colognes, or deodorant.            Do not shave 48 hours prior to surgery.  Men may shave face and neck.            Do not bring valuables to the hospital.            [facility] is not responsible for any belongings or valuables.  Do NOT Smoke (Tobacco/Vaping) or drink Alcohol 24 hours prior to your procedure If you use a CPAP at night, you may bring all equipment for your overnight stay.   Contacts, glasses, dentures or bridgework may not be worn into surgery.      For patients admitted to the hospital, discharge time will be determined by your treatment team.   Patients discharged the day of surgery will not be allowed to drive home, and someone needs to stay with them for 24 hours.    Special instructions:   [place]- Preparing For Surgery  Before surgery, you can play an important role. Because skin is not sterile, your skin needs to be as free of germs as possible. You can reduce the number of germs on your skin by washing with CHG (chlorahexidine gluconate) Soap before surgery.  CHG is an antiseptic cleaner which kills germs and bonds with the skin to  continue killing germs even after washing.    Oral Hygiene is also important to reduce your risk of infection.  Remember - BRUSH YOUR TEETH THE MORNING OF SURGERY WITH YOUR REGULAR TOOTHPASTE  Please do not use if you have an allergy to CHG or antibacterial soaps. If your skin becomes reddened/irritated stop using the CHG.  Do not shave (including legs and underarms) for at least 48 hours prior to first CHG shower. It is OK to shave your face.  Please follow these instructions carefully.   1. Shower the NIGHT BEFORE SURGERY and the MORNING OF SURGERY with CHG Soap.   2. If you chose to wash your hair, wash your hair first as usual with your normal shampoo.  3. After you shampoo, rinse your hair and body thoroughly to remove the shampoo.  4. Use CHG as you would any other liquid soap. You can apply CHG directly to the skin and wash gently with a scrungie or a clean washcloth.   5. Apply the CHG Soap to your body ONLY FROM THE NECK DOWN.  Do not use on open wounds or open sores. Avoid contact with your eyes, ears, mouth and genitals (private parts).  Wash Face and genitals (private parts)  with your normal soap.   6. Wash thoroughly, paying special attention to the area where your surgery will be performed.  7. Thoroughly rinse your body with warm water from the neck down.  8. DO NOT shower/wash with your normal soap after using and rinsing off the CHG Soap.  9. Pat yourself dry with a CLEAN TOWEL.  10. Wear CLEAN PAJAMAS to bed the night before surgery  11. Place CLEAN SHEETS on your bed the night of your first shower and DO NOT SLEEP WITH PETS.   Day of Surgery: Wear Clean/Comfortable clothing the morning of surgery Do not apply any deodorants/lotions.   Remember to brush your teeth WITH YOUR REGULAR TOOTHPASTE.   Please read over the following fact sheets that you were given.

## 2020-04-17 ENCOUNTER — Encounter (HOSPITAL_COMMUNITY): Payer: Self-pay

## 2020-04-17 ENCOUNTER — Encounter (HOSPITAL_COMMUNITY)
Admission: RE | Admit: 2020-04-17 | Discharge: 2020-04-17 | Disposition: A | Discharge status: Home / Self Care | Payer: No Typology Code available for payment source | Source: Ambulatory Visit | Attending: Orthopedic Surgery | Admitting: Orthopedic Surgery

## 2020-04-17 ENCOUNTER — Other Ambulatory Visit: Payer: Self-pay

## 2020-04-17 DIAGNOSIS — Z79899 Other long term (current) drug therapy: Secondary | ICD-10-CM | POA: Insufficient documentation

## 2020-04-17 DIAGNOSIS — I10 Essential (primary) hypertension: Secondary | ICD-10-CM | POA: Diagnosis not present

## 2020-04-17 DIAGNOSIS — Z01818 Encounter for other preprocedural examination: Secondary | ICD-10-CM | POA: Insufficient documentation

## 2020-04-17 HISTORY — DX: Essential (primary) hypertension: I10

## 2020-04-17 HISTORY — DX: Headache, unspecified: R51.9

## 2020-04-17 LAB — URINALYSIS, ROUTINE W REFLEX MICROSCOPIC
Bilirubin Urine: NEGATIVE
Glucose, UA: NEGATIVE mg/dL
Hgb urine dipstick: NEGATIVE
Ketones, ur: NEGATIVE mg/dL
Leukocytes,Ua: NEGATIVE
Nitrite: NEGATIVE
Protein, ur: NEGATIVE mg/dL
Specific Gravity, Urine: 1.027 (ref 1.005–1.030)
pH: 5 (ref 5.0–8.0)

## 2020-04-17 LAB — PROTIME-INR
INR: 1 (ref 0.8–1.2)
Prothrombin Time: 12.7 seconds (ref 11.4–15.2)

## 2020-04-17 LAB — CBC
HCT: 47.3 % (ref 39.0–52.0)
Hemoglobin: 15.5 g/dL (ref 13.0–17.0)
MCH: 28.8 pg (ref 26.0–34.0)
MCHC: 32.8 g/dL (ref 30.0–36.0)
MCV: 87.9 fL (ref 80.0–100.0)
Platelets: 197 10*3/uL (ref 150–400)
RBC: 5.38 MIL/uL (ref 4.22–5.81)
RDW: 14.3 % (ref 11.5–15.5)
WBC: 5 10*3/uL (ref 4.0–10.5)
nRBC: 0 % (ref 0.0–0.2)

## 2020-04-17 LAB — TYPE AND SCREEN
ABO/RH(D): A POS
Antibody Screen: NEGATIVE

## 2020-04-17 LAB — BASIC METABOLIC PANEL
Anion gap: 9 (ref 5–15)
BUN: 13 mg/dL (ref 6–20)
CO2: 24 mmol/L (ref 22–32)
Calcium: 9.4 mg/dL (ref 8.9–10.3)
Chloride: 103 mmol/L (ref 98–111)
Creatinine, Ser: 0.82 mg/dL (ref 0.61–1.24)
GFR, Estimated: >60 mL/min (ref >60)
Glucose, Bld: 104 mg/dL — ABNORMAL HIGH (ref 70–99)
Potassium: 3.9 mmol/L (ref 3.5–5.1)
Sodium: 136 mmol/L (ref 135–145)

## 2020-04-17 LAB — SURGICAL PCR SCREEN
MRSA, PCR: NEGATIVE
Staphylococcus aureus: NEGATIVE

## 2020-04-17 LAB — APTT: aPTT: 30 seconds (ref 24–36)

## 2020-04-17 NOTE — Progress Notes (Signed)
PCP - N/A Cardiologist - Denies  PPM/ICD - Denies  Chest x-ray - 04/17/20 EKG - 04/17/20 Stress Test - Denies ECHO - Denies Cardiac Cath - Denies  Sleep Study - Denies  Patient denies having diabetes.   Blood Thinner Instructions: N/A Aspirin Instructions: N/A  ERAS Protcol - No  COVID TEST- 04/18/20   Anesthesia review: Yes, per MD order  Patient denies shortness of breath, fever, cough and chest pain at PAT appointment   All instructions explained to the patient, with a verbal understanding of the material. Patient agrees to go over the instructions while at home for a better understanding. Patient also instructed to self quarantine after being tested for COVID-19. The opportunity to ask questions was provided.

## 2020-04-18 ENCOUNTER — Other Ambulatory Visit (HOSPITAL_COMMUNITY)
Admission: RE | Admit: 2020-04-18 | Discharge: 2020-04-18 | Disposition: A | Discharge status: Home / Self Care | Payer: Self-pay | Source: Ambulatory Visit | Attending: Orthopedic Surgery | Admitting: Orthopedic Surgery

## 2020-04-18 DIAGNOSIS — Z20822 Contact with and (suspected) exposure to covid-19: Secondary | ICD-10-CM | POA: Insufficient documentation

## 2020-04-18 LAB — SARS CORONAVIRUS 2 (TAT 6-24 HRS): SARS Coronavirus 2: NEGATIVE

## 2020-04-20 NOTE — Progress Notes (Signed)
Anesthesia Chart Review:  Hypertension poorly controlled at PAT visit, 136/105.  He has recently established with primary care through [facility] and is seen by Geryl Councilman, PA-C for management of new diagnosis of hypertension.  He was started on lisinopril with improvement in blood pressure.  However, he does state that he had run out prior to his PAT visit did not pick up a refill until after he was seen that day.  He has since restarted the medication and said he had a blood pressure recheck at the primary care office on 10/25 and stated he could not remember the exact readings but was told was under good control.  Patient was previously cleared for surgery per note by Geryl Councilman, PA-C dated 02/21/2020.  Preop labs reviewed, unremarkable.  EKG 04/17/2020: Normal sinus rhythm.  Rate 73.  CHEST - 2 VIEW 04/17/2020:  COMPARISON:  None.  FINDINGS: The heart size and mediastinal contours are within normal limits. Both lungs are clear. The visualized skeletal structures are unremarkable.  IMPRESSION: No active cardiopulmonary disease.   Wynonia Musty [facility] Short Stay Center/Anesthesiology Phone [phone] 04/20/2020 2:04 PM

## 2020-04-20 NOTE — Anesthesia Preprocedure Evaluation (Addendum)
Anesthesia Evaluation    Reviewed: Allergy & Precautions, Patient's Chart, lab work & pertinent test results, Unable to perform ROS - Chart review only  Airway        Dental   Pulmonary neg pulmonary ROS, Current Smoker and Patient abstained from smoking.,           Cardiovascular hypertension,      Neuro/Psych  Headaches,    GI/Hepatic negative GI ROS, Neg liver ROS,   Endo/Other  negative endocrine ROS  Renal/GU negative Renal ROS     Musculoskeletal negative musculoskeletal ROS (+)   Abdominal   Peds  Hematology negative hematology ROS (+)   Anesthesia Other Findings   Reproductive/Obstetrics                                                            Anesthesia Evaluation  Patient identified by MRN, date of birth, ID band Patient awake    Reviewed: Allergy & Precautions, H&P , NPO status , Patient's Chart, lab work & pertinent test results  Airway Mallampati: III TM Distance: >3 FB Neck ROM: Full    Dental  (+)  Previous hx of fractured mandible. S/P ORIF:   Pulmonary Current Smoker,          Cardiovascular     Neuro/Psych    GI/Hepatic   Endo/Other    Renal/GU      Musculoskeletal   Abdominal   Peds  Hematology   Anesthesia Other Findings   Reproductive/Obstetrics                          Anesthesia Physical Anesthesia Plan  ASA: II and Emergent  Anesthesia Plan: General   Post-op Pain Management:    Induction: Intravenous, Rapid sequence and Cricoid pressure planned  Airway Management Planned: Oral ETT  Additional Equipment:   Intra-op Plan:   Post-operative Plan: Extubation in OR  Informed Consent: I have reviewed the patients History and Physical, chart, labs and discussed the procedure including the risks, benefits and alternatives for the proposed anesthesia with the patient or authorized representative who has  indicated his/her understanding and acceptance.   Dental advisory given  Plan Discussed with: CRNA and Anesthesiologist  Anesthesia Plan Comments:         Anesthesia Quick Evaluation  Anesthesia Physical Anesthesia Plan  ASA: II  Anesthesia Plan: General   Post-op Pain Management:    Induction: Intravenous  PONV Risk Score and Plan: 2 and Ondansetron, Dexamethasone, Treatment may vary due to age or medical condition and Midazolam  Airway Management Planned: Oral ETT  Additional Equipment:   Intra-op Plan:   Post-operative Plan: Extubation in OR  Informed Consent:   Plan Discussed with:   Anesthesia Plan Comments: Lissa Hoard note: BP control worse this AM. Diastolic ranges from 737-106. Both arms check. BP 170s/117 in both arms. Discussed with Rolena Infante. Pt should have BP managed as outpatient before proceeding with surgery.   PAT note by Karoline Caldwell, PA-C: Hypertension poorly controlled at PAT visit, 136/105.  He has recently established with primary care through [facility] and is seen by Geryl Councilman, PA-C for management of new diagnosis of hypertension.  He was started on lisinopril with improvement in blood pressure.  However, he  does state that he had run out prior to his PAT visit did not pick up a refill until after he was seen that day.  He has since restarted the medication and said he had a blood pressure recheck at the primary care office on 10/25 and stated he could not remember the exact readings but was told was under good control.  Patient was previously cleared for surgery per note by Geryl Councilman, PA-C dated 02/21/2020.  Preop labs reviewed, unremarkable.  EKG 04/17/2020: Normal sinus rhythm.  Rate 73.  CHEST - 2 VIEW 04/17/2020:  COMPARISON:  None.  2 x PIV, +/- aline     FINDINGS: The heart size and mediastinal contours are within normal limits. Both lungs are clear. The visualized skeletal structures  are unremarkable.  IMPRESSION: No active cardiopulmonary disease.  )      Anesthesia Quick Evaluation

## 2020-04-22 ENCOUNTER — Encounter (HOSPITAL_COMMUNITY)
Admission: RE | Disposition: A | Discharge status: Home / Self Care | Payer: Self-pay | Source: Home / Self Care | Attending: Orthopedic Surgery

## 2020-04-22 ENCOUNTER — Encounter (HOSPITAL_COMMUNITY): Payer: Self-pay | Admitting: Orthopedic Surgery

## 2020-04-22 ENCOUNTER — Other Ambulatory Visit: Payer: Self-pay

## 2020-04-22 ENCOUNTER — Ambulatory Visit (HOSPITAL_COMMUNITY)
Admission: RE | Admit: 2020-04-22 | Discharge: 2020-04-22 | Disposition: A | Discharge status: Home / Self Care | Payer: No Typology Code available for payment source | Attending: Orthopedic Surgery | Admitting: Orthopedic Surgery

## 2020-04-22 DIAGNOSIS — F17200 Nicotine dependence, unspecified, uncomplicated: Secondary | ICD-10-CM | POA: Insufficient documentation

## 2020-04-22 DIAGNOSIS — M549 Dorsalgia, unspecified: Secondary | ICD-10-CM | POA: Diagnosis present

## 2020-04-22 DIAGNOSIS — M5410 Radiculopathy, site unspecified: Secondary | ICD-10-CM | POA: Insufficient documentation

## 2020-04-22 DIAGNOSIS — I10 Essential (primary) hypertension: Secondary | ICD-10-CM | POA: Diagnosis not present

## 2020-04-22 DIAGNOSIS — Z5309 Procedure and treatment not carried out because of other contraindication: Secondary | ICD-10-CM | POA: Insufficient documentation

## 2020-04-22 LAB — ABO/RH: ABO/RH(D): A POS

## 2020-04-22 SURGERY — TRANSFORAMINAL LUMBAR INTERBODY FUSION (TLIF) WITH PEDICLE SCREW FIXATION 1 LEVEL
Anesthesia: General

## 2020-04-22 MED ORDER — CEFAZOLIN SODIUM-DEXTROSE 2-4 GM/100ML-% IV SOLN
2.0000 g | INTRAVENOUS | Status: DC
  Filled 2020-04-22: qty 100

## 2020-04-22 MED ORDER — MIDAZOLAM HCL 2 MG/2ML IJ SOLN
INTRAMUSCULAR | Status: AC
  Filled 2020-04-22: qty 2

## 2020-04-22 MED ORDER — LISINOPRIL 20 MG PO TABS
20.0000 mg | ORAL_TABLET | Freq: Every day | ORAL | Status: DC
  Administered 2020-04-22: 20 mg via ORAL
  Filled 2020-04-22: qty 1

## 2020-04-22 MED ORDER — CHLORHEXIDINE GLUCONATE 0.12 % MT SOLN
15.0000 mL | Freq: Once | OROMUCOSAL | Status: AC
  Administered 2020-04-22: 15 mL via OROMUCOSAL
  Filled 2020-04-22: qty 15

## 2020-04-22 MED ORDER — LACTATED RINGERS IV SOLN: INTRAVENOUS | Status: DC

## 2020-04-22 MED ORDER — PROPOFOL 10 MG/ML IV BOLUS
INTRAVENOUS | Status: AC
  Filled 2020-04-22: qty 20

## 2020-04-22 MED ORDER — ORAL CARE MOUTH RINSE: 15.0000 mL | Freq: Once | OROMUCOSAL | Status: AC

## 2020-04-22 MED ORDER — HYDROCHLOROTHIAZIDE 25 MG PO TABS
25.0000 mg | ORAL_TABLET | Freq: Every day | ORAL | Status: DC
  Administered 2020-04-22: 25 mg via ORAL
  Filled 2020-04-22: qty 1

## 2020-04-22 MED ORDER — BUPIVACAINE-EPINEPHRINE (PF) 0.25% -1:200000 IJ SOLN
INTRAMUSCULAR | Status: AC
  Filled 2020-04-22: qty 30

## 2020-04-22 MED ORDER — FENTANYL CITRATE (PF) 250 MCG/5ML IJ SOLN
INTRAMUSCULAR | Status: AC
  Filled 2020-04-22: qty 5

## 2020-04-22 MED ORDER — BUPIVACAINE LIPOSOME 1.3 % IJ SUSP
20.0000 mL | INTRAMUSCULAR | Status: DC
  Filled 2020-04-22: qty 20

## 2020-04-22 MED ORDER — HYDROCHLOROTHIAZIDE 25 MG PO TABS
25.0000 mg | ORAL_TABLET | Freq: Every day | ORAL | 1 refills | Status: DC
Start: 2020-04-22 — End: 2021-07-22

## 2020-04-22 MED ORDER — THROMBIN (RECOMBINANT) 20000 UNITS EX SOLR
CUTANEOUS | Status: AC
  Filled 2020-04-22: qty 20000

## 2020-04-22 NOTE — H&P (Signed)
Patient presents today for definitive management of his severe back pain and radicular left leg pain.  Plan surgical procedure was a transforaminal lumbar interbody fusion at L4-5.  During preoperative evaluation this morning by the anesthesiologist patient had elevated blood pressure with diastolic values ranging from 117-126.  At this time I spoke with the anesthesiologist and he expressed significant concern about moving forward with surgery given the uncontrolled hypertension.  I have spoken with the patient and he expresses understanding.  I will contact the hospitalist team for a consult to determine if he can be admitted for acute hypertensive control.  If we can control his hypertension he possibly can be put back on the schedule for tomorrow.

## 2020-04-22 NOTE — Progress Notes (Addendum)
Surgery cancelled due to elevated BP. Surgeon and anesthesiologist spoke with patient, patient verbalized understanding. Hospitalist consult placed by surgeon. Patient aware, resting comfortably, no c/o of pain, blurry vision, and  Mild headache 5/10 per patient.

## 2020-04-22 NOTE — Progress Notes (Signed)
Hospitalists Dr. Lorin Mercy placed orders for BP meds to be given. Orders received.

## 2020-04-22 NOTE — Progress Notes (Signed)
Patient verbalized understanding of Dr.Yates' medication instructions. Patient discharged to home via transportation from sister-in-law, NT walked patient out to [place] area.

## 2020-04-22 NOTE — Consult Note (Signed)
Consult Note   Duane Lambert UTM:[id number] DOB: 08/10/1960 DOA: 04/22/2020  PCP:  Loree Fee at [facility], last saw her on Monday Consultants:  None Patient coming from:  Home - lives alone; NOK: [place], [place], [phone]   Chief Complaint:  Uncontrolled HTN   HPI: Duane Lambert is a 59 y.o. male with medical history significant of HTN presenting for lumbar fusion, found to have significantly elevated BP.  He has been seeing Whitney at [facility] and she started him on BP pills about a month ago.  It was hard for him to get them since he ran out, but he picked it up last Friday.  He did not take one this AM.  He does not check his BP at home.  He woke up with a headache.  His headache is going away now.  He feels ok now, has mild headache but easing off.  He has been having back problem since June 9 - intermittent LBP and thigh muscle spasms with radiculopathy.  It happens maybe twice a week.  Does not prevent him from doing his usual activities.  He is more concerned about his BP than about the need for surgery.  He has been on disability due to remote back injury.     ED Course:  Here today for spine fusion but anesthesia cancelled the case due to uncontrolled HTN.  Elevated diastolic BPs, acknowledges headache.  He was supposed to be taking Lisinopril.  Review of Systems: As per HPI; otherwise review of systems reviewed and negative.   Ambulatory Status:  Ambulates with a cane  COVID Vaccine Status:   Complete  Past Medical History:  Diagnosis Date  . Headache   . Hypertension     Past Surgical History:  Procedure Laterality Date  . APPENDECTOMY    . LAPAROSCOPIC APPENDECTOMY  09/09/2011   Procedure: APPENDECTOMY LAPAROSCOPIC;  Surgeon: Doreen Salvage, MD;  Location: [facility];  Service: General;  Laterality: N/A;    Social History   Socioeconomic History  . Marital status: Single    Spouse name: Not on file  . Number of children: Not on file  . Years of  education: Not on file  . Highest education level: Not on file  Occupational History  . Not on file  Tobacco Use  . Smoking status: Current Every Day Smoker    Packs/day: 0.50  . Smokeless tobacco: Current User  Vaping Use  . Vaping Use: Never used  Substance and Sexual Activity  . Alcohol use: No  . Drug use: Not Currently  . Sexual activity: Not on file  Other Topics Concern  . Not on file  Social History Narrative  . Not on file   Social Determinants of Health   Financial Resource Strain:   . Difficulty of Paying Living Expenses: Not on file  Food Insecurity:   . Worried About Charity fundraiser in the Last Year: Not on file  . Ran Out of Food in the Last Year: Not on file  Transportation Needs:   . Lack of Transportation (Medical): Not on file  . Lack of Transportation (Non-Medical): Not on file  Physical Activity:   . Days of Exercise per Week: Not on file  . Minutes of Exercise per Session: Not on file  Stress:   . Feeling of Stress : Not on file  Social Connections:   . Frequency of Communication with Friends and Family: Not on file  . Frequency of Social Gatherings with Friends and  Family: Not on file  . Attends Religious Services: Not on file  . Active Member of Clubs or Organizations: Not on file  . Attends Archivist Meetings: Not on file  . Marital Status: Not on file  Intimate Partner Violence:   . Fear of Current or Ex-Partner: Not on file  . Emotionally Abused: Not on file  . Physically Abused: Not on file  . Sexually Abused: Not on file    No Known Allergies  History reviewed. No pertinent family history.  Prior to Admission medications   Medication Sig Start Date End Date Taking? Authorizing Provider  gabapentin (NEURONTIN) 300 MG capsule Take 300 mg by mouth daily as needed (pain).   Yes [provider]  lisinopril (ZESTRIL) 20 MG tablet Take 20 mg by mouth daily. 03/07/20  Yes [provider]  traMADol (ULTRAM) 50  MG tablet Take 50 mg by mouth 3 (three) times daily as needed for moderate pain.   Yes [provider]  methocarbamol (ROBAXIN) 500 MG tablet Take 1 tablet (500 mg total) by mouth 2 (two) times daily. Patient taking differently: Take 500 mg by mouth 3 (three) times daily as needed for muscle spasms.  12/04/19   Garald Balding, PA-C  methylPREDNISolone (MEDROL DOSEPAK) 4 MG TBPK tablet Take as directed on package Patient not taking: Reported on 04/09/2020 12/04/19   Garald Balding, PA-C    Physical Exam: Vitals:   04/22/20 0705 04/22/20 0716 04/22/20 0853  BP:  (!) 178/114 (!) 160/104  Pulse: 88    Resp: 18    Temp: 97.7 F (36.5 C)    TempSrc: Oral    SpO2: 99%    Weight: 74.3 kg    Height: 5\' 7"  (1.702 m)       . General:  Appears calm and comfortable and is NAD . Eyes:   EOMI, normal lids, iris . ENT:  grossly normal hearing, lips & tongue, mmm; appropriate dentition . Neck:  no LAD, masses or thyromegaly . Cardiovascular:  RRR, no m/r/g. No LE edema.  Marland Kitchen Respiratory:   CTA bilaterally with no wheezes/rales/rhonchi.  Normal respiratory effort. . Abdomen:  soft, NT, ND, NABS . Skin:  no rash or induration seen on limited exam . Musculoskeletal:  grossly normal tone BUE/BLE, good ROM, no bony abnormality . Psychiatric:  grossly normal mood and affect, speech fluent and appropriate, AOx3 . Neurologic:  CN 2-12 grossly intact, moves all extremities in coordinated fashion    Radiological Exams on Admission: Independently reviewed - see discussion in A/P where applicable  No results found.  EKG: not done today   Labs on Admission: I have personally reviewed the available labs and imaging studies at the time of the admission.  Pertinent labs:   None today   Assessment/Plan Principal Problem:   Uncontrolled hypertension Active Problems:   Back pain    Uncontrolled HTN -Patient with long-standing HTN -He has been on medication for this issue for 1 month, but  ran out of medications and stopped taking -He reports resuming the medication on Monday (so he took 1-2 pills this week), but that he was told not to take it this AM pre-operatively -He has a mild, improving headache without apparent neurological compromise -This appears to be long-standing, uncontrolled HTN rather than HTN crisis -There does not appear to be an indication for admission at this time -Instead, I have counseled him regarding the importance of long-standing HTN control -Will continue Lisinopril 20 mg daily as  per PCP -Will also add HCTZ 25 mg PO daily and have sent in an rx to his requested pharmacy ([facility] on [address]) for #30 with 1 RF -I have requested that he f/u with his PCP (Whitney at [facility]) within a week  -He is recommended to have improved BP control for roughly a month prior to rescheduled surgery  Back pain -Worker's comp injury in June -Having radicular symptoms -Needs L4-5 fusion -Will reschedule surgery once his BP is controlled  Tobacco dependence -Encourage cessation.   -This was discussed with the patient and should be reviewed on an ongoing basis.   -Smoking cessation may help to improve his back pain.    Note: This patient has been tested and is negative for the novel coronavirus COVID-19. The patient has been fully vaccinated against COVID-19.   Thank you for this interesting consult.  I have ordered HCTZ in addition to Lisinopril to work on improving his long-term BP control.  He will be given a dose of both BP medications prior to d/c today and should f/u with his PCP within a week.   Karmen Bongo MD [facility]   How to contact the [facility] Attending or Consulting provider [place] or covering provider during after hours [employer], for this patient?  1. Check the care team in [facility] and look for a) attending/consulting [facility] provider listed and b) the [facility] team listed 2. Log into [email or web address] and use [place]'s universal password to  access. If you do not have the password, please contact the hospital operator. 3. Locate the [facility] provider you are looking for under [facility] and page to a number that you can be directly reached. 4. If you still have difficulty reaching the provider, please page the [facility] (Director on Call) for the Hospitalists listed on amion for assistance.   04/22/2020, 9:22 AM

## 2021-07-21 ENCOUNTER — Ambulatory Visit: Payer: Self-pay

## 2021-07-21 ENCOUNTER — Other Ambulatory Visit: Payer: Self-pay

## 2021-07-22 ENCOUNTER — Ambulatory Visit: Payer: Self-pay | Admitting: Physician Assistant

## 2021-07-22 ENCOUNTER — Other Ambulatory Visit: Payer: Self-pay

## 2021-07-22 VITALS — BP 147/101 | HR 96 | Temp 98.2°F | Resp 18 | Ht 67.0 in | Wt 164.0 lb

## 2021-07-22 DIAGNOSIS — Z6825 Body mass index (BMI) 25.0-25.9, adult: Secondary | ICD-10-CM

## 2021-07-22 DIAGNOSIS — Z13220 Encounter for screening for lipoid disorders: Secondary | ICD-10-CM

## 2021-07-22 DIAGNOSIS — Z114 Encounter for screening for human immunodeficiency virus [HIV]: Secondary | ICD-10-CM

## 2021-07-22 DIAGNOSIS — Z125 Encounter for screening for malignant neoplasm of prostate: Secondary | ICD-10-CM

## 2021-07-22 DIAGNOSIS — I10 Essential (primary) hypertension: Secondary | ICD-10-CM

## 2021-07-22 DIAGNOSIS — D170 Benign lipomatous neoplasm of skin and subcutaneous tissue of head, face and neck: Secondary | ICD-10-CM

## 2021-07-22 DIAGNOSIS — Z1159 Encounter for screening for other viral diseases: Secondary | ICD-10-CM

## 2021-07-22 MED ORDER — LISINOPRIL 20 MG PO TABS
20.0000 mg | ORAL_TABLET | Freq: Every day | ORAL | 2 refills | Status: DC
  Filled 2021-07-22: qty 30, 30d supply, fill #0
  Filled 2021-08-23: qty 30, 30d supply, fill #1
  Filled 2021-09-20: qty 30, 30d supply, fill #2

## 2021-07-22 NOTE — Progress Notes (Signed)
New Patient Office Visit  Subjective:  Patient ID: Duane Lambert, male    DOB: 07/03/1960  Age: 61 y.o. MRN: [id number]  CC:  Chief Complaint  Patient presents with   nodule    face    HPI Duane Lambert states that he has has a nodule on his right temple for "years", states that he feels that it has gotten bigger over the past two years. Denies injury/trauma  Denies tenderness, purulence, has not tried anything for relief.  States that he has been out of lisinopril for the past two weeks, states that he does check his BP at work and when he had his lisinopril and reports blood pressure readings within normal limits.  No other concerns at this time.   Past Medical History:  Diagnosis Date   Headache    Hypertension     Past Surgical History:  Procedure Laterality Date   APPENDECTOMY     LAPAROSCOPIC APPENDECTOMY  09/09/2011   Procedure: APPENDECTOMY LAPAROSCOPIC;  Surgeon: Doreen Salvage, MD;  Location: [facility];  Service: General;  Laterality: N/A;    History reviewed. No pertinent family history.  Social History   Socioeconomic History   Marital status: Single    Spouse name: Not on file   Number of children: Not on file   Years of education: Not on file   Highest education level: Not on file  Occupational History   Not on file  Tobacco Use   Smoking status: Every Day    Packs/day: 0.50    Types: Cigarettes   Smokeless tobacco: Current  Vaping Use   Vaping Use: Never used  Substance and Sexual Activity   Alcohol use: No   Drug use: Not Currently   Sexual activity: Not on file  Other Topics Concern   Not on file  Social History Narrative   Not on file   Social Determinants of Health   Financial Resource Strain: Not on file  Food Insecurity: Not on file  Transportation Needs: Not on file  Physical Activity: Not on file  Stress: Not on file  Social Connections: Not on file  Intimate Partner Violence: Not on file    ROS Review of Systems   Constitutional:  Negative for chills and fever.  HENT: Negative.    Eyes:  Negative for photophobia and visual disturbance.  Respiratory:  Negative for cough and shortness of breath.   Cardiovascular:  Negative for chest pain.  Gastrointestinal: Negative.   Endocrine: Negative.   Genitourinary: Negative.   Musculoskeletal: Negative.   Skin:  Negative for color change and wound.  Allergic/Immunologic: Negative.   Neurological: Negative.   Hematological: Negative.   Psychiatric/Behavioral: Negative.     Objective:   Today's Vitals: BP (!) 147/101 (BP Location: Left Arm, Patient Position: Sitting, Cuff Size: Normal)    Pulse 96    Temp 98.2 F (36.8 C) (Oral)    Resp 18    Ht 5\' 7"  (1.702 m)    Wt 164 lb (74.4 kg)    SpO2 97%    BMI 25.69 kg/m   Physical Exam Vitals and nursing note reviewed.  Constitutional:      Appearance: Normal appearance.  HENT:     Head: Normocephalic and atraumatic.     Right Ear: External ear normal.     Left Ear: External ear normal.     Nose: Nose normal.     Mouth/Throat:     Mouth: Mucous membranes are moist.  Pharynx: Oropharynx is clear.  Eyes:     Extraocular Movements: Extraocular movements intact.     Conjunctiva/sclera: Conjunctivae normal.     Pupils: Pupils are equal, round, and reactive to light.  Cardiovascular:     Rate and Rhythm: Normal rate and regular rhythm.     Pulses: Normal pulses.     Heart sounds: Normal heart sounds.  Pulmonary:     Effort: Pulmonary effort is normal.     Breath sounds: Normal breath sounds.  Musculoskeletal:        General: Normal range of motion.     Cervical back: Normal range of motion and neck supple.  Skin:    General: Skin is warm and dry.     Comments: See photos  Neurological:     General: No focal deficit present.     Mental Status: He is alert and oriented to person, place, and time.  Psychiatric:        Mood and Affect: Mood normal.        Behavior: Behavior normal.         Thought Content: Thought content normal.        Judgment: Judgment normal.        Verbal consent given by patient to have photo included in chart  Assessment & Plan:   Problem List Items Addressed This Visit       Cardiovascular and Mediastinum   Essential hypertension - Primary   Relevant Medications   lisinopril (ZESTRIL) 20 MG tablet   Other Relevant Orders   CBC with Differential/Platelet   Comp. Metabolic Panel (12)   TSH     Other   Lipoma of head   Relevant Orders   Ambulatory referral to General Surgery   Other Visit Diagnoses     Screening, lipid       Relevant Orders   Lipid panel   Encounter for HCV screening test for low risk patient       Relevant Orders   HCV Ab w Reflex to Quant PCR   Screening for HIV (human immunodeficiency virus)       Relevant Orders   HIV antibody (with reflex)   HCV Ab w Reflex to Quant PCR   Screening PSA (prostate specific antigen)       Relevant Orders   PSA   BMI 25.0-25.9,adult           Outpatient Encounter Medications as of 07/22/2021  Medication Sig   lisinopril (ZESTRIL) 20 MG tablet Take 1 tablet (20 mg total) by mouth daily.   [DISCONTINUED] gabapentin (NEURONTIN) 300 MG capsule Take 300 mg by mouth daily as needed (pain).   [DISCONTINUED] hydrochlorothiazide (HYDRODIURIL) 25 MG tablet Take 1 tablet (25 mg total) by mouth daily.   [DISCONTINUED] lisinopril (ZESTRIL) 20 MG tablet Take 20 mg by mouth daily.   [DISCONTINUED] methocarbamol (ROBAXIN) 500 MG tablet Take 1 tablet (500 mg total) by mouth 2 (two) times daily. (Patient taking differently: Take 500 mg by mouth 3 (three) times daily as needed for muscle spasms. )   [DISCONTINUED] traMADol (ULTRAM) 50 MG tablet Take 50 mg by mouth 3 (three) times daily as needed for moderate pain.   No facility-administered encounter medications on file as of 07/22/2021.   1. Essential hypertension Resume lisinopril.  Patient encouraged to continue checking blood pressure  at home, keeping a written log and having available for all office visits.  Patient to return to the mobile unit on July 27, 2021 for  fasting labs.  Patient given application for [place] financial assistance, patient given [facility].  Red flags given for prompt reevaluation - CBC with Differential/Platelet; Future - Comp. Metabolic Panel (12); Future - TSH; Future - lisinopril (ZESTRIL) 20 MG tablet; Take 1 tablet (20 mg total) by mouth daily.  Dispense: 30 tablet; Refill: 2  2. Lipoma of head Patient education given on supportive care - Ambulatory referral to General Surgery  3. Screening, lipid  - Lipid panel; Future  4. Encounter for HCV screening test for low risk patient  - HCV Ab w Reflex to Quant PCR; Future  5. Screening for HIV (human immunodeficiency virus)  - HIV antibody (with reflex); Future - HCV Ab w Reflex to Quant PCR; Future  6. Screening PSA (prostate specific antigen)  - PSA; Future   I have reviewed the patient's medical history (PMH, PSH, Social History, Family History, Medications, and allergies) , and have been updated if relevant. I spent 30 minutes reviewing chart and  face to face time with patient.    Follow-up: Return in about 5 days (around 07/27/2021) for Fasting  labs.   Loraine Grip Mayers, PA-C

## 2021-07-22 NOTE — Progress Notes (Signed)
Patient reports cyst being present for 4 years. The past 2 years area has grown. Patient denies pain or discomfort in the area. Patient last took BP medication yesterday and has eaten today.

## 2021-07-22 NOTE — Patient Instructions (Signed)
You are going to continue taking lisinopril 20 mg once a day I do encourage you to keep checking your blood pressure while you are at work, keep a written log and have available for when you follow-up with your new primary care provider.  Please return to the mobile unit Tuesday at 230 for your fasting labs to be completed.  It is okay to have something light for breakfast but do not eat for at least 5 or 6 hours prior to that lab to be completed.  I started a referral for you to be seen by general surgery for your fatty lipoma.  Kennieth Rad, PA-C Physician Assistant [facility] [email or web address]   Lipoma A lipoma is a noncancerous (benign) tumor that is made up of fat cells. This is a very common type of soft-tissue growth. Lipomas are usually found under the skin (subcutaneous). They may occur in any tissue of the body that contains fat. Common areas for lipomas to appear include the back, arms, shoulders, buttocks, and thighs. Lipomas grow slowly, and they are usually painless. Most lipomas do not cause problems and do not require treatment. What are the causes? The cause of this condition is not known. What increases the risk? You are more likely to develop this condition if: You are 2-32 years old. You have a family history of lipomas. What are the signs or symptoms? A lipoma usually appears as a small, round bump under the skin. In most cases, the lump will: Feel soft or rubbery. Not cause pain or other symptoms. However, if a lipoma is located in an area where it pushes on nerves, it can become painful or cause other symptoms. How is this diagnosed? A lipoma can usually be diagnosed with a physical exam. You may also have tests to confirm the diagnosis and to rule out other conditions. Tests may include: Imaging tests, such as a CT scan or an MRI. Removal of a tissue sample to be looked at under a microscope  (biopsy). How is this treated? Treatment for this condition depends on the size of the lipoma and whether it is causing any symptoms. For small lipomas that are not causing problems, no treatment is needed. If a lipoma is bigger or it causes problems, surgery may be done to remove the lipoma. Lipomas can also be removed to improve appearance. Most often, the procedure is done after applying a medicine that numbs the area (local anesthetic). Liposuction may be done to reduce the size of the lipoma before it is removed through surgery, or it may be done to remove the lipoma. Lipomas are removed with this method in order to limit incision size and scarring. A liposuction tube is inserted through a small incision into the lipoma, and the contents of the lipoma are removed through the tube with suction. Follow these instructions at home: Watch your lipoma for any changes. Keep all follow-up visits as told by your health care provider. This is important. Contact a health care provider if: Your lipoma becomes larger or hard. Your lipoma becomes painful, red, or increasingly swollen. These could be signs of infection or a more serious condition. Get help right away if: You develop tingling or numbness in an area near the lipoma. This could indicate that the lipoma is causing nerve damage. Summary A lipoma is a noncancerous tumor that is made up of fat cells. Most lipomas do not cause problems and do not require treatment. If a lipoma is bigger or it  causes problems, surgery may be done to remove the lipoma. Contact a health care provider if your lipoma becomes larger or hard, or if it becomes painful, red, or increasingly swollen. Pain, redness, and swelling could be signs of infection or a more serious condition. This information is not intended to replace advice given to you by your health care provider. Make sure you discuss any questions you have with your health care provider. Document Revised:  01/28/2019 Document Reviewed: 01/28/2019 Elsevier Patient Education  [place].

## 2021-07-27 ENCOUNTER — Other Ambulatory Visit: Payer: Self-pay

## 2021-07-27 DIAGNOSIS — Z13220 Encounter for screening for lipoid disorders: Secondary | ICD-10-CM

## 2021-07-27 DIAGNOSIS — I10 Essential (primary) hypertension: Secondary | ICD-10-CM

## 2021-07-27 DIAGNOSIS — E782 Mixed hyperlipidemia: Secondary | ICD-10-CM

## 2021-07-27 DIAGNOSIS — Z125 Encounter for screening for malignant neoplasm of prostate: Secondary | ICD-10-CM

## 2021-07-27 DIAGNOSIS — Z114 Encounter for screening for human immunodeficiency virus [HIV]: Secondary | ICD-10-CM

## 2021-07-27 DIAGNOSIS — R972 Elevated prostate specific antigen [PSA]: Secondary | ICD-10-CM

## 2021-07-27 DIAGNOSIS — Z1159 Encounter for screening for other viral diseases: Secondary | ICD-10-CM

## 2021-07-27 NOTE — Progress Notes (Signed)
Patient remembered to not eat prior to lab visit.  Patient tolerated successful blood draw from right antecubital space.

## 2021-07-27 NOTE — Patient Instructions (Signed)
Patient is aware of receiving a phone call regarding results this coming Monday.

## 2021-07-28 ENCOUNTER — Other Ambulatory Visit: Payer: Self-pay

## 2021-07-28 LAB — COMP. METABOLIC PANEL (12)
AST: 34 IU/L (ref 0–40)
Albumin/Globulin Ratio: 1.4 (ref 1.2–2.2)
Albumin: 4.3 g/dL (ref 3.8–4.9)
Alkaline Phosphatase: 90 IU/L (ref 44–121)
BUN/Creatinine Ratio: 13 (ref 10–24)
BUN: 12 mg/dL (ref 8–27)
Bilirubin Total: 0.3 mg/dL (ref 0.0–1.2)
Calcium: 9.5 mg/dL (ref 8.6–10.2)
Chloride: 104 mmol/L (ref 96–106)
Creatinine, Ser: 0.89 mg/dL (ref 0.76–1.27)
Globulin, Total: 3.1 g/dL (ref 1.5–4.5)
Glucose: 80 mg/dL (ref 70–99)
Potassium: 4.7 mmol/L (ref 3.5–5.2)
Sodium: 140 mmol/L (ref 134–144)
Total Protein: 7.4 g/dL (ref 6.0–8.5)
eGFR: 98 mL/min/{1.73_m2} (ref >59)

## 2021-07-28 LAB — CBC WITH DIFFERENTIAL/PLATELET
Basophils Absolute: 0 10*3/uL (ref 0.0–0.2)
Basos: 1 %
EOS (ABSOLUTE): 0.1 10*3/uL (ref 0.0–0.4)
Eos: 1 %
Hematocrit: 44.5 % (ref 37.5–51.0)
Hemoglobin: 14.6 g/dL (ref 13.0–17.7)
Immature Grans (Abs): 0 10*3/uL (ref 0.0–0.1)
Immature Granulocytes: 0 %
Lymphocytes Absolute: 4.1 10*3/uL — ABNORMAL HIGH (ref 0.7–3.1)
Lymphs: 61 %
MCH: 28.8 pg (ref 26.6–33.0)
MCHC: 32.8 g/dL (ref 31.5–35.7)
MCV: 88 fL (ref 79–97)
Monocytes Absolute: 0.7 10*3/uL (ref 0.1–0.9)
Monocytes: 11 %
Neutrophils Absolute: 1.7 10*3/uL (ref 1.4–7.0)
Neutrophils: 26 %
Platelets: 195 10*3/uL (ref 150–450)
RBC: 5.07 x10E6/uL (ref 4.14–5.80)
RDW: 14 % (ref 11.6–15.4)
WBC: 6.6 10*3/uL (ref 3.4–10.8)

## 2021-07-28 LAB — TSH: TSH: 1.55 u[IU]/mL (ref 0.450–4.500)

## 2021-07-28 LAB — LIPID PANEL
Chol/HDL Ratio: 5.2 ratio — ABNORMAL HIGH (ref 0.0–5.0)
Cholesterol, Total: 220 mg/dL — ABNORMAL HIGH (ref 100–199)
HDL: 42 mg/dL (ref >39)
LDL Chol Calc (NIH): 159 mg/dL — ABNORMAL HIGH (ref 0–99)
Triglycerides: 107 mg/dL (ref 0–149)
VLDL Cholesterol Cal: 19 mg/dL (ref 5–40)

## 2021-07-28 LAB — HCV AB W REFLEX TO QUANT PCR: HCV Ab: <0.1 s/co ratio (ref 0.0–0.9)

## 2021-07-28 LAB — PSA: Prostate Specific Ag, Serum: 7 ng/mL — ABNORMAL HIGH (ref 0.0–4.0)

## 2021-07-28 LAB — HCV INTERPRETATION

## 2021-07-28 LAB — HIV ANTIBODY (ROUTINE TESTING W REFLEX): HIV Screen 4th Generation wRfx: NONREACTIVE

## 2021-07-28 MED ORDER — ATORVASTATIN CALCIUM 20 MG PO TABS
20.0000 mg | ORAL_TABLET | Freq: Every day | ORAL | 3 refills | Status: DC
  Filled 2021-07-28: qty 30, 30d supply, fill #0

## 2021-07-28 NOTE — Addendum Note (Signed)
Addended by: Kennieth Rad on: 07/28/2021 10:16 AM   Modules accepted: Orders

## 2021-07-29 ENCOUNTER — Encounter: Payer: Self-pay | Admitting: *Deleted

## 2021-07-29 ENCOUNTER — Ambulatory Visit: Payer: Self-pay | Admitting: Surgery

## 2021-07-29 ENCOUNTER — Telehealth: Payer: Self-pay | Admitting: *Deleted

## 2021-07-29 NOTE — Telephone Encounter (Signed)
MA UTR patient on contacts and VM states invalid request. Communication letter will be mailed to on fle address.

## 2021-07-30 ENCOUNTER — Encounter: Payer: Self-pay | Admitting: *Deleted

## 2021-08-04 ENCOUNTER — Other Ambulatory Visit: Payer: Self-pay

## 2021-08-10 ENCOUNTER — Encounter: Payer: Self-pay | Admitting: *Deleted

## 2021-08-13 ENCOUNTER — Emergency Department (HOSPITAL_COMMUNITY)
Admission: EM | Admit: 2021-08-13 | Discharge: 2021-08-14 | Disposition: A | Discharge status: Home / Self Care | Payer: Self-pay | Attending: Emergency Medicine | Admitting: Emergency Medicine

## 2021-08-13 ENCOUNTER — Other Ambulatory Visit: Payer: Self-pay

## 2021-08-13 ENCOUNTER — Encounter (HOSPITAL_COMMUNITY): Payer: Self-pay | Admitting: *Deleted

## 2021-08-13 DIAGNOSIS — I10 Essential (primary) hypertension: Secondary | ICD-10-CM | POA: Insufficient documentation

## 2021-08-13 DIAGNOSIS — L600 Ingrowing nail: Secondary | ICD-10-CM | POA: Insufficient documentation

## 2021-08-13 DIAGNOSIS — Z79899 Other long term (current) drug therapy: Secondary | ICD-10-CM | POA: Insufficient documentation

## 2021-08-13 NOTE — ED Triage Notes (Signed)
The pt has an ingrown toenail rt second toe for 2 weeks

## 2021-08-13 NOTE — ED Provider Triage Note (Signed)
Emergency Medicine Provider Triage Evaluation Note  Duane Lambert , a 61 y.o. male  was evaluated in triage.  Pt complains of ingrown toenail.  States that same has been ongoing for the past several weeks.  Pain to right second toe.  No history of diabetes, no fevers or chills.  No purulent drainage.  States he attempted to cut his toe but was unable to.  Review of Systems  Positive:  Negative: See above  Physical Exam  BP (!) 142/108 (BP Location: Left Arm)    Pulse 79    Temp 98.7 F (37.1 C) (Oral)    Resp 16    Ht 5\' 7"  (1.702 m)    Wt 74.4 kg    SpO2 94%    BMI 25.69 kg/m  Gen:   Awake, no distress  Resp:  Normal effort  MSK:   Moves extremities without difficulty  Other:  Right second toenail grown out and the distal end has curved into the bottom of the right toe. No erythema, warmth, or purulent drainage noted  Medical Decision Making  Medically screening exam initiated at 9:08 PM.  Appropriate orders placed.  Duane Lambert was informed that the remainder of the evaluation will be completed by another provider, this initial triage assessment does not replace that evaluation, and the importance of remaining in the ED until their evaluation is complete.     Nestor Lewandowsky 08/13/21 2111

## 2021-08-14 ENCOUNTER — Other Ambulatory Visit: Payer: Self-pay | Admitting: Podiatry

## 2021-08-14 MED ORDER — OXYCODONE-ACETAMINOPHEN 5-325 MG PO TABS: 1.0000 | ORAL_TABLET | ORAL | 0 refills | Status: DC | PRN

## 2021-08-14 NOTE — ED Notes (Signed)
Reviewed discharge instructions with patient. Follow-up care reviewed. Patient verbalized understanding. Patient A&Ox4, VSS, and ambulatory with steady gait upon discharge.  

## 2021-08-14 NOTE — Discharge Instructions (Addendum)
You were seen in the emergency department for an ingrown toenail.  As we discussed I want you to see the podiatrist to have this cut. You can soak the affected toe in warm, soapy water for 10 to 20 minutes twice daily and then use a hydrocortisone cream (from the drugstore).  I've given you the contact information for the podiatrist for you to call and make an appointment to have this cut. [facility] is open from 8-11am today.

## 2021-08-14 NOTE — ED Provider Notes (Signed)
[facility] EMERGENCY DEPARTMENT Provider Note   CSN: [id number] Arrival date & time: 08/13/21  1740     History  Chief Complaint  Patient presents with   Ingrown Toenail    Duane Lambert is a 61 y.o. male who is seen in the emergency department for an ingrown toenail of the right second toe.  Patient states that this has been ongoing for the past 2 weeks.  He attempted to cut himself, and has been soaking it in Epsom salts, with no relief.  No history of diabetes. No fevers or chills, no purulent drainage.  HPI     Home Medications Prior to Admission medications   Medication Sig Start Date End Date Taking? Authorizing Provider  atorvastatin (LIPITOR) 20 MG tablet Take 1 tablet (20 mg total) by mouth daily. 07/28/21   Mayers, Cari S, PA-C  lisinopril (ZESTRIL) 20 MG tablet Take 1 tablet (20 mg total) by mouth daily. 07/22/21   Mayers, Cari S, PA-C      Allergies    Patient has no known allergies.    Review of Systems   Review of Systems  Constitutional:  Negative for chills and fever.  Skin:        Ingrown toenail  All other systems reviewed and are negative.  Physical Exam Updated Vital Signs BP (!) 136/104 (BP Location: Right Arm)    Pulse 81    Temp 97.8 F (36.6 C)    Resp 18    Ht 5\' 7"  (1.702 m)    Wt 74.4 kg    SpO2 97%    BMI 25.69 kg/m  Physical Exam Vitals and nursing note reviewed.  Constitutional:      Appearance: Normal appearance.  HENT:     Head: Normocephalic and atraumatic.  Eyes:     Conjunctiva/sclera: Conjunctivae normal.  Pulmonary:     Effort: Pulmonary effort is normal. No respiratory distress.  Musculoskeletal:     Comments: Full ROM of all digits on the affected foot.  Good capillary refill.  Sensation intact.  Skin:    General: Skin is warm and dry.     Comments: Right second toe nail long and distal and curved into the bottom of the right toe.  Appears to be ingrown towards the pad of the toe.  No erythema, warmth,  or purulent drainage.  Neurological:     Mental Status: He is alert.  Psychiatric:        Mood and Affect: Mood normal.        Behavior: Behavior normal.    ED Results / Procedures / Treatments   Labs (all labs ordered are listed, but only abnormal results are displayed) Labs Reviewed - No data to display  EKG None  Radiology No results found.  Procedures Procedures    Medications Ordered in ED Medications - No data to display  ED Course/ Medical Decision Making/ A&P                           Medical Decision Making Patient is a 61 year old male who presents the emergency department complaining of an ingrown toenail for the past 2 weeks.  Patient has no comorbidities which would affect normal wound healing.  On my exam patient is afebrile, not tachycardic, and in no acute distress.  On visualization of his foot, the right second toenail is growing out at the distal end, and curved into the bottom of the right toe.  Appear to be ingrown towards the pad of the right second toe.  Patient has normal range of motion of all toes, good capillary refill and sensation.  No overlying erythema or purulent drainage.  Discussed with patient that I would feel more comfortable if the podiatrist were to cut this.  Encouraged warm soaks and hydrocortisone cream until patient is able to see the podiatrist.  Patient given the contact information for the podiatrist, and encouraged to call and make an appointment.  We discussed reasons to return to the emergency department.  Patient's agreeable to the plan.   Final Clinical Impression(s) / ED Diagnoses Final diagnoses:  Ingrown toenail of right foot    Rx / DC Orders ED Discharge Orders     None      Portions of this report may have been transcribed using voice recognition software. Every effort was made to ensure accuracy; however, inadvertent computerized transcription errors may be present.    Estill Cotta 08/14/21  8177    Davonna Belling, MD 08/14/21 [phone]

## 2021-08-16 ENCOUNTER — Telehealth: Payer: Self-pay | Admitting: Podiatry

## 2021-08-16 ENCOUNTER — Ambulatory Visit: Payer: Self-pay | Admitting: Podiatry

## 2021-08-16 NOTE — Telephone Encounter (Signed)
Called patient 2 times this morning to get them scheduled for an ingrown toenail. No answer

## 2021-08-16 NOTE — Telephone Encounter (Signed)
Patient has been scheduled for today @ 2 with Dr. Amalia Hailey

## 2021-08-23 ENCOUNTER — Other Ambulatory Visit: Payer: Self-pay

## 2021-08-24 ENCOUNTER — Other Ambulatory Visit: Payer: Self-pay

## 2021-09-06 ENCOUNTER — Ambulatory Visit: Payer: Self-pay | Admitting: Podiatry

## 2021-09-20 ENCOUNTER — Other Ambulatory Visit: Payer: Self-pay

## 2021-09-20 ENCOUNTER — Ambulatory Visit: Payer: Self-pay | Attending: Internal Medicine | Admitting: Internal Medicine

## 2021-09-20 ENCOUNTER — Encounter: Payer: Self-pay | Admitting: Internal Medicine

## 2021-09-20 VITALS — BP 130/100 | HR 97 | Ht 66.0 in | Wt 163.4 lb

## 2021-09-20 DIAGNOSIS — E782 Mixed hyperlipidemia: Secondary | ICD-10-CM | POA: Insufficient documentation

## 2021-09-20 DIAGNOSIS — D2330 Other benign neoplasm of skin of unspecified part of face: Secondary | ICD-10-CM

## 2021-09-20 DIAGNOSIS — R972 Elevated prostate specific antigen [PSA]: Secondary | ICD-10-CM | POA: Insufficient documentation

## 2021-09-20 DIAGNOSIS — Z7689 Persons encountering health services in other specified circumstances: Secondary | ICD-10-CM

## 2021-09-20 DIAGNOSIS — F17200 Nicotine dependence, unspecified, uncomplicated: Secondary | ICD-10-CM | POA: Insufficient documentation

## 2021-09-20 DIAGNOSIS — I10 Essential (primary) hypertension: Secondary | ICD-10-CM

## 2021-09-20 MED ORDER — ATORVASTATIN CALCIUM 10 MG PO TABS
10.0000 mg | ORAL_TABLET | Freq: Every day | ORAL | 4 refills | Status: DC
  Filled 2021-09-20: qty 30, 30d supply, fill #0
  Filled 2021-10-20 – 2021-10-29 (×2): qty 30, 30d supply, fill #1
  Filled 2021-11-30: qty 30, 30d supply, fill #2
  Filled 2021-12-30: qty 30, 30d supply, fill #3
  Filled 2022-01-31: qty 30, 30d supply, fill #4

## 2021-09-20 NOTE — Patient Instructions (Signed)
Get forms for orange card/cone discount card from [facility] desk today.  Once approved, let me know so we can refer to urology for the abnormal prostate cancer screening test results.  ?

## 2021-09-20 NOTE — Progress Notes (Signed)
? ? ?Patient ID: Duane Lambert, male    DOB: April 01, 1961  MRN: [id number] ? ?CC: Establish Care ? ? ?Subjective: ?Duane Lambert is a 61 y.o. male who presents for new pt visit ?His concerns today include:  ?Hx of HTN, HL, elev PSA, tob dep. ? ?Saw PA Mayers 06/2021 at [facility] [place] 06/2021 and had blood test done.  They were unable to reach him via phone so they sent him a lab letter.  Reports he never received the letter. ?-I went over these lab results with him today.  PSA was elevated at 7.  They referred him to urology but patient states he has not been called. ?-LDL cholesterol was 159 with ASCVD calculated at 29.7% at the time that the test was done.  She had sent prescription to the pharmacy for him to start atorvastatin but patient was not aware of the prescription so has not picked it up.  Screen for HIV and hepatitis C were negative.  Kidney and liver function normal.  Blood cell count normal. ? ?HTN: On lisinopril 20 mg daily.  Tolerating the medication without problems.  Just picked up RF on Lisinopril.  Did not take med as yet for today. ?He limits salt in foods ?No HA/Dizziness/CP or SOB ?Tob dep:  smoked for 20 yrs.  1/2 pk a day.  Never quit in past.  Wants to quit.  Plans to purchase nicotine patches over-the-counter. ?Has a cyst on the side of the right eye which he states has been present for years but has increased in size.  He would like to have it removed.   The PA referred him to general surgery when she saw him back in January to have this removed.  However he has not received an appointment.  Patient is uninsured.  He does not have the orange card or cone discount card. ? ?Patient Active Problem List  ? Diagnosis Date Noted  ? 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 Essential hypertension 04/22/2020  ? Back pain 04/22/2020  ? Postop check 09/22/2011  ?  ? ?Current Outpatient Medications on File Prior to Visit  ?Medication Sig Dispense Refill  ? lisinopril (ZESTRIL) 20 MG tablet Take 1 tablet (20 mg total)  by mouth daily. 30 tablet 2  ? ?No current facility-administered medications on file prior to visit.  ? ? ?No Known Allergies ? ?Social History  ? ?Socioeconomic History  ? Marital status: Single  ?  Spouse name: Not on file  ? Number of children: Not on file  ? Years of education: Not on file  ? Highest education level: Not on file  ?Occupational History  ? Not on file  ?Tobacco Use  ? Smoking status: Every Day  ?  Packs/day: 1.00  ?  Types: Cigarettes  ? Smokeless tobacco: Current  ?Vaping Use  ? Vaping Use: Never used  ?Substance and Sexual Activity  ? Alcohol use: No  ? Drug use: Not Currently  ? Sexual activity: Not Currently  ?Other Topics Concern  ? Not on file  ?Social History Narrative  ? Not on file  ? ?Social Determinants of Health  ? ?Financial Resource Strain: Not on file  ?Food Insecurity: Not on file  ?Transportation Needs: Not on file  ?Physical Activity: Not on file  ?Stress: Not on file  ?Social Connections: Not on file  ?Intimate Partner Violence: Not on file  ? ? ?No family history on file. ? ?Past Surgical History:  ?Procedure Laterality Date  ? APPENDECTOMY    ?  LAPAROSCOPIC APPENDECTOMY  09/09/2011  ? Procedure: APPENDECTOMY LAPAROSCOPIC;  Surgeon: Doreen Salvage, MD;  Location: [facility];  Service: General;  Laterality: N/A;  ? ? ?ROS: ?Review of Systems ?Negative except as stated above ? ?PHYSICAL EXAM: ?BP (!) 130/100   Pulse 97   Ht '5\' 6"'$  (1.676 m)   Wt 163 lb 6.4 oz (74.1 kg)   SpO2 96%   BMI 26.37 kg/m?   ?Physical Exam ? ? ?General appearance - alert, well appearing, and in no distress ?Mental status - normal mood, behavior, speech, dress, motor activity, and thought processes ?Chest - clear to auscultation, no wheezes, rales or rhonchi, symmetric air entry ?Heart - normal rate, regular rhythm, normal S1, S2, no murmurs, rubs, clicks or gallops ?Extremities - peripheral pulses normal, no pedal edema, no clubbing or cyanosis ?Skin -has about a 1 cm raised soft movable cyst lateral to the  right eye.  It is nontender to touch. ? ?The 10-year ASCVD risk score (Arnett DK, et al., 2019) is: 24.3% ?  Values used to calculate the score: ?    Age: 23 years ?    Sex: Male ?    Is Non-Hispanic African American: Yes ?    Diabetic: No ?    Tobacco smoker: Yes ?    Systolic Blood Pressure: 962 mmHg ?    Is BP treated: Yes ?    HDL Cholesterol: 42 mg/dL ?    Total Cholesterol: 220 mg/dL ? ? ?  Latest Ref Rng & Units 07/27/2021  ? 12:47 PM 04/17/2020  ?  8:35 AM 07/25/2017  ?  3:39 PM  ?CMP  ?Glucose 70 - 99 mg/dL 80   104   97    ?BUN 8 - 27 mg/dL '12   13   6    '$ ?Creatinine 0.76 - 1.27 mg/dL 0.89   0.82   0.55    ?Sodium 134 - 144 mmol/L 140   136   132    ?Potassium 3.5 - 5.2 mmol/L 4.7   3.9   4.6    ?Chloride 96 - 106 mmol/L 104   103   103    ?CO2 22 - 32 mmol/L  24   22    ?Calcium 8.6 - 10.2 mg/dL 9.5   9.4   9.0    ?Total Protein 6.0 - 8.5 g/dL 7.4    7.8    ?Total Bilirubin 0.0 - 1.2 mg/dL 0.3    0.7    ?Alkaline Phos 44 - 121 IU/L 90    77    ?AST 0 - 40 IU/L 34    39    ?ALT 17 - 63 U/L   33    ? ?Lipid Panel  ?   ?Component Value Date/Time  ? CHOL 220 (H) 07/27/2021 1247  ? TRIG 107 07/27/2021 1247  ? HDL 42 07/27/2021 1247  ? CHOLHDL 5.2 (H) 07/27/2021 1247  ? LDLCALC 159 (H) 07/27/2021 1247  ? ? ?CBC ?   ?Component Value Date/Time  ? WBC 6.6 07/27/2021 1247  ? WBC 5.0 04/17/2020 0835  ? RBC 5.07 07/27/2021 1247  ? RBC 5.38 04/17/2020 0835  ? HGB 14.6 07/27/2021 1247  ? HCT 44.5 07/27/2021 1247  ? PLT 195 07/27/2021 1247  ? MCV 88 07/27/2021 1247  ? MCH 28.8 07/27/2021 1247  ? MCH 28.8 04/17/2020 0835  ? MCHC 32.8 07/27/2021 1247  ? MCHC 32.8 04/17/2020 0835  ? RDW 14.0 07/27/2021 1247  ? LYMPHSABS 4.1 (H)  07/27/2021 1247  ? MONOABS 1.4 (H) 09/08/2011 2218  ? EOSABS 0.1 07/27/2021 1247  ? BASOSABS 0.0 07/27/2021 1247  ? ? ?ASSESSMENT AND PLAN: ?1. Establishing care with new doctor, encounter for ?Went over with patient how to apply for the orange card/cone discount card.  Encouraged him to do so and  once he is approved he should let me know so that we can refer him to the urologist for the elevated PSA and to dermatology or general surgeon to have the cyst removed from his face. ? ?2. Essential hypertension ?Not at goal but patient has not taken lisinopril as yet for today.  No change made in medication.  He will take his medicine as soon as he returns home.  Follow-up with clinical pharmacist in a few weeks for repeat blood pressure check. ? ?3. Mixed hyperlipidemia ?Start atorvastatin due to increased cardiovascular risk.  Went over possible side effects of the medicine including muscle cramps and drug-induced hepatitis. ?- atorvastatin (LIPITOR) 10 MG tablet; Take 1 tablet (10 mg total) by mouth daily.  Dispense: 30 tablet; Refill: 4 ? ?4. Tobacco dependence ?Pt is current smoker. ?Patient advised to quit smoking. ?Discussed health risks associated with smoking including lung and other types of cancers, chronic lung diseases and CV risks.Marland Kitchen ?Pt ready to give trail of quitting.   ?Discussed methods to help quit including quitting cold [place], use of NRT, Chantix and Bupropion.  ?Pt wanting to try: Nicotine patches.  He declines me writing prescription for it.  States he will purchase over-the-counter.  Encouraged to set a quit date. ?_3_ Minutes spent on counseling. ?F/U:   ? ? ?5. Elevated PSA ?Went over with him what is the PSA and the significance of it being elevated.  Needs to have this evaluated further by urology to rule out any underlying prostate CA.  Encouraged him to apply for the orange card/cone discount card ASAP and once approved let me know. ? ?6. Cyst, dermoid, face ?See #1 above. ? ? ? ?Patient was given the opportunity to ask questions.  Patient verbalized understanding of the plan and was able to repeat key elements of the plan.  ? ?This documentation was completed using Radio producer.  Any transcriptional errors are unintentional. ? ?No orders of the defined types  were placed in this encounter. ? ? ? ?Requested Prescriptions  ? ?Signed Prescriptions Disp Refills  ? atorvastatin (LIPITOR) 10 MG tablet 30 tablet 4  ?  Sig: Take 1 tablet (10 mg total) by mouth daily.  ? ? ?Retur

## 2021-09-21 ENCOUNTER — Other Ambulatory Visit: Payer: Self-pay

## 2021-09-24 ENCOUNTER — Other Ambulatory Visit: Payer: Self-pay

## 2021-10-07 ENCOUNTER — Ambulatory Visit: Payer: Self-pay | Admitting: Pharmacist

## 2021-10-08 ENCOUNTER — Other Ambulatory Visit: Payer: Self-pay

## 2021-10-20 ENCOUNTER — Other Ambulatory Visit: Payer: Self-pay

## 2021-10-21 ENCOUNTER — Other Ambulatory Visit: Payer: Self-pay

## 2021-10-21 ENCOUNTER — Other Ambulatory Visit: Payer: Self-pay | Admitting: Internal Medicine

## 2021-10-21 DIAGNOSIS — I10 Essential (primary) hypertension: Secondary | ICD-10-CM

## 2021-10-22 NOTE — Telephone Encounter (Signed)
Requested medication (s) are due for refill today: yes ? ?Requested medication (s) are on the active medication list: yes ? ?Last refill:  07/22/21 #30 2 refills ? ?Future visit scheduled: yes in 2 months  ? ?Notes to clinic:  last ordered by Carrolyn Meiers, PA. Do you want to refill Rx? ? ? ?  ?Requested Prescriptions  ?Pending Prescriptions Disp Refills  ? lisinopril (ZESTRIL) 20 MG tablet 30 tablet 2  ?  Sig: Take 1 tablet (20 mg total) by mouth daily.  ?  ? Cardiovascular:  ACE Inhibitors Failed - 10/22/2021 11:31 AM  ?  ?  Failed - Last BP in normal range  ?  BP Readings from Last 1 Encounters:  ?09/20/21 (!) 130/100  ?  ?  ?  ?  Passed - Cr in normal range and within 180 days  ?  Creatinine, Ser  ?Date Value Ref Range Status  ?07/27/2021 0.89 0.76 - 1.27 mg/dL Final  ?  ?  ?  ?  Passed - K in normal range and within 180 days  ?  Potassium  ?Date Value Ref Range Status  ?07/27/2021 4.7 3.5 - 5.2 mmol/L Final  ?  ?  ?  ?  Passed - Patient is not pregnant  ?  ?  Passed - Valid encounter within last 6 months  ?  Recent Outpatient Visits   ? ?      ? 1 month ago Establishing care with new doctor, encounter for  ? Chester Ladell Pier, MD  ? ?  ?  ?Future Appointments   ? ?        ? In 2 weeks Daisy Blossom, Jarome Matin, [place]  ? In 2 months Ladell Pier, MD [facility]  ? ?  ? ? ?  ?  ?  ? ?

## 2021-10-28 ENCOUNTER — Other Ambulatory Visit: Payer: Self-pay

## 2021-10-29 ENCOUNTER — Other Ambulatory Visit: Payer: Self-pay

## 2021-10-29 ENCOUNTER — Other Ambulatory Visit: Payer: Self-pay | Admitting: Pharmacist

## 2021-10-29 DIAGNOSIS — I10 Essential (primary) hypertension: Secondary | ICD-10-CM

## 2021-10-29 MED ORDER — LISINOPRIL 20 MG PO TABS
20.0000 mg | ORAL_TABLET | Freq: Every day | ORAL | 2 refills | Status: DC
  Filled 2021-10-29: qty 30, 30d supply, fill #0
  Filled 2021-11-30: qty 30, 30d supply, fill #1
  Filled 2021-12-30: qty 30, 30d supply, fill #2

## 2021-10-29 NOTE — Telephone Encounter (Signed)
Pt is calling back to follow up on a medication refill. Pt stated he is completely out of medication; it has been out for four days.  ? ?Pt is requesting a call back today. ? ?Please advise. ?

## 2021-11-01 ENCOUNTER — Emergency Department (HOSPITAL_COMMUNITY): Payer: Self-pay

## 2021-11-01 ENCOUNTER — Emergency Department (HOSPITAL_COMMUNITY)
Admission: EM | Admit: 2021-11-01 | Discharge: 2021-11-01 | Disposition: A | Discharge status: Home / Self Care | Payer: Self-pay | Attending: Emergency Medicine | Admitting: Emergency Medicine

## 2021-11-01 ENCOUNTER — Other Ambulatory Visit: Payer: Self-pay

## 2021-11-01 ENCOUNTER — Encounter (HOSPITAL_COMMUNITY): Payer: Self-pay | Admitting: Emergency Medicine

## 2021-11-01 DIAGNOSIS — I10 Essential (primary) hypertension: Secondary | ICD-10-CM | POA: Insufficient documentation

## 2021-11-01 DIAGNOSIS — K4091 Unilateral inguinal hernia, without obstruction or gangrene, recurrent: Secondary | ICD-10-CM

## 2021-11-01 DIAGNOSIS — K4090 Unilateral inguinal hernia, without obstruction or gangrene, not specified as recurrent: Secondary | ICD-10-CM | POA: Insufficient documentation

## 2021-11-01 LAB — CBC WITH DIFFERENTIAL/PLATELET
Abs Immature Granulocytes: 0.01 10*3/uL (ref 0.00–0.07)
Basophils Absolute: 0 10*3/uL (ref 0.0–0.1)
Basophils Relative: 0 %
Eosinophils Absolute: 0.1 10*3/uL (ref 0.0–0.5)
Eosinophils Relative: 1 %
HCT: 42.5 % (ref 39.0–52.0)
Hemoglobin: 14.4 g/dL (ref 13.0–17.0)
Immature Granulocytes: 0 %
Lymphocytes Relative: 51 %
Lymphs Abs: 3 10*3/uL (ref 0.7–4.0)
MCH: 29.6 pg (ref 26.0–34.0)
MCHC: 33.9 g/dL (ref 30.0–36.0)
MCV: 87.4 fL (ref 80.0–100.0)
Monocytes Absolute: 0.6 10*3/uL (ref 0.1–1.0)
Monocytes Relative: 11 %
Neutro Abs: 2.2 10*3/uL (ref 1.7–7.7)
Neutrophils Relative %: 37 %
Platelets: 197 10*3/uL (ref 150–400)
RBC: 4.86 MIL/uL (ref 4.22–5.81)
RDW: 13.6 % (ref 11.5–15.5)
WBC: 5.9 10*3/uL (ref 4.0–10.5)
nRBC: 0 % (ref 0.0–0.2)

## 2021-11-01 LAB — COMPREHENSIVE METABOLIC PANEL
ALT: 45 U/L — ABNORMAL HIGH (ref 0–44)
AST: 39 U/L (ref 15–41)
Albumin: 3.5 g/dL (ref 3.5–5.0)
Alkaline Phosphatase: 56 U/L (ref 38–126)
Anion gap: 8 (ref 5–15)
BUN: 11 mg/dL (ref 6–20)
CO2: 27 mmol/L (ref 22–32)
Calcium: 9.3 mg/dL (ref 8.9–10.3)
Chloride: 100 mmol/L (ref 98–111)
Creatinine, Ser: 0.81 mg/dL (ref 0.61–1.24)
GFR, Estimated: >60 mL/min (ref >60)
Glucose, Bld: 118 mg/dL — ABNORMAL HIGH (ref 70–99)
Potassium: 3.8 mmol/L (ref 3.5–5.1)
Sodium: 135 mmol/L (ref 135–145)
Total Bilirubin: 0.7 mg/dL (ref 0.3–1.2)
Total Protein: 7.3 g/dL (ref 6.5–8.1)

## 2021-11-01 MED ORDER — HYDROMORPHONE HCL 1 MG/ML IJ SOLN
1.0000 mg | Freq: Once | INTRAMUSCULAR | Status: AC
  Administered 2021-11-01: 1 mg via INTRAVENOUS
  Filled 2021-11-01: qty 1

## 2021-11-01 MED ORDER — DIAZEPAM 5 MG PO TABS
5.0000 mg | ORAL_TABLET | Freq: Once | ORAL | Status: AC
  Administered 2021-11-01: 5 mg via ORAL
  Filled 2021-11-01: qty 1

## 2021-11-01 MED ORDER — ONDANSETRON HCL 4 MG/2ML IJ SOLN
4.0000 mg | Freq: Once | INTRAMUSCULAR | Status: AC
  Administered 2021-11-01: 4 mg via INTRAVENOUS

## 2021-11-01 MED ORDER — MORPHINE SULFATE (PF) 4 MG/ML IV SOLN: 4.0000 mg | Freq: Once | INTRAVENOUS | Status: DC

## 2021-11-01 MED ORDER — ONDANSETRON HCL 4 MG/2ML IJ SOLN
INTRAMUSCULAR | Status: AC
  Filled 2021-11-01: qty 2

## 2021-11-01 MED ORDER — OXYCODONE-ACETAMINOPHEN 5-325 MG PO TABS
1.0000 | ORAL_TABLET | Freq: Once | ORAL | Status: AC
  Administered 2021-11-01: 1 via ORAL
  Filled 2021-11-01: qty 1

## 2021-11-01 NOTE — ED Provider Notes (Signed)
Supervised resident visit.  Patient here with right groin pain.  Appears to have a right inguinal hernia on exam.  Came out about 10 hours ago.  He is very tender and uncomfortable.  There is no overlying skin changes.  He has been having no nausea or vomiting.  Has been passing gas.  History of the same but usually hernia will go back in.  Tried some p.o. Valium and narcotics without much success.  After a couple doses of IV narcotics patient was relaxed enough and I was able to reduce the hernia.  I talked with Dr. Grandville Silos with general surgery who will follow-up with him outpatient.  Given that we are able to reduce the hernia and he is feeling much better without any discomfort can safely follow-up outpatient.  He understands return if symptoms are worsening.  Recommend no straining or heavy lifting.  Blood work was obtained and per my review and interpretation was unremarkable.  Discharged in good condition. ? ?This chart was dictated using voice recognition software.  Despite best efforts to proofread,  errors can occur which can change the documentation meaning.  ?  Lennice Sites, DO ?11/01/21 2007 ? ?

## 2021-11-01 NOTE — ED Notes (Signed)
Pt removed himself from tele monitor, this RN will continue to monitor pt. ?

## 2021-11-01 NOTE — ED Provider Triage Note (Signed)
Emergency Medicine Provider Triage Evaluation Note ? ?Duane Lambert , a 61 y.o. male  was evaluated in triage.  Pt complains of pain to the right side of his groin.  Reports that he has had pain over the last 3 to 4 days.  Pain has gotten progressively worse today.  Patient noticed swelling to the right side of his groin as well. ? ?Denies any fever, chills, dysuria, hematuria, urinary urgency, swelling or tenderness to genitals, genital sores or lesions, penile discharge, abdominal pain, nausea, vomiting. ? ?Review of Systems  ?Positive: Right-sided groin pain ?Negative: See above ? ?Physical Exam  ?BP 130/79 (BP Location: Right Arm)   Pulse 83   Temp 99.1 ?F (37.3 ?C) (Oral)   Resp 20   Ht '5\' 7"'$  (1.702 m)   Wt 74.4 kg   SpO2 100%   BMI 25.69 kg/m?  ?Gen:   Awake, no distress   ?Resp:  Normal effort  ?MSK:   Moves extremities without difficulty  ?Other:  Abdomen soft, nondistended, diffuse tenderness to bilateral lower quadrants.  Tenderness and swelling to right groin.  Suspect inguinal hernia.   ? ?Medical Decision Making  ?Medically screening exam initiated at 3:40 PM.  Appropriate orders placed.  Duane Lambert was informed that the remainder of the evaluation will be completed by another provider, this initial triage assessment does not replace that evaluation, and the importance of remaining in the ED until their evaluation is complete. ? ?I was unable to reduce the hernia while patient was in triage as we could not move him to a supine position.  We will hold on any labs or imaging at this time until reduction can be attempted while the patient laying in supine position. ?  ?Loni Beckwith, PA-C ?11/01/21 1542 ? ?

## 2021-11-01 NOTE — ED Notes (Signed)
Patient verbalizes understanding of d/c instructions. Opportunities for questions and answers were provided. Pt d/c from ED and ambulated to lobby.  

## 2021-11-01 NOTE — Discharge Instructions (Addendum)
Please call the number provided for Dr. Grandville Silos to schedule a follow-up appointment with him (for general surgery). ?

## 2021-11-01 NOTE — ED Notes (Signed)
Pt became nauseous and vomited on the floor. Provider aware ? ?

## 2021-11-01 NOTE — ED Provider Notes (Signed)
?[facility] ?Provider Note ? ? ?CSN: [id number] ?Arrival date & time: 11/01/21  1522 ? ?  ? ?History ? ?Chief Complaint  ?Patient presents with  ? Groin Pain  ? ? ?Duane Lambert is a 61 y.o. male. ? ? ?Groin Pain ?Pertinent negatives include no chest pain, no abdominal pain and no shortness of breath. 61 year old male with history of HTN presenting to the ED with right groin pain.  Patient states over the past 3 to 4 days he has noticed intermittent swelling in his right groin that have been mildly painful.  This morning, he states that after he woke up he noted persistent swelling in his right groin that has not resolved today.  The pain has been slowly increasing throughout the day so he came to the ED for further evaluation.  He has no history of hernias.  Has had a prior appendectomy.  Denies any associated fevers, abdominal pain, nausea/vomiting/diarrhea, or other infectious symptoms. ?  ? ?Home Medications ?Prior to Admission medications   ?Medication Sig Start Date End Date Taking? Authorizing Provider  ?atorvastatin (LIPITOR) 10 MG tablet Take 1 tablet (10 mg total) by mouth daily. 09/20/21   Ladell Pier, MD  ?lisinopril (ZESTRIL) 20 MG tablet Take 1 tablet (20 mg total) by mouth daily. 10/29/21   Charlott Rakes, MD  ?   ? ?Allergies    ?Patient has no known allergies.   ? ?Review of Systems   ?Review of Systems  ?Constitutional:  Negative for fever.  ?Respiratory:  Negative for shortness of breath.   ?Cardiovascular:  Negative for chest pain.  ?Gastrointestinal:  Negative for abdominal pain, diarrhea, nausea and vomiting.  ?Genitourinary:  Negative for difficulty urinating, dysuria, frequency, penile discharge, penile pain and testicular pain.  ?     Positive for right groin pain/swelling.  ?Skin:  Negative for rash.  ? ?Physical Exam ?Updated Vital Signs ?BP (!) 147/101   Pulse 79   Temp 99.3 ?F (37.4 ?C) (Oral)   Resp 16   Ht '5\' 7"'$  (1.702 m)   Wt 74.4 kg    SpO2 94%   BMI 25.69 kg/m?  ?Physical Exam ?Exam conducted with a chaperone present.  ?Constitutional:   ?   Appearance: He is not ill-appearing, toxic-appearing or diaphoretic.  ?   Comments: Uncomfortable appearing.  ?HENT:  ?   Right Ear: External ear normal.  ?   Left Ear: External ear normal.  ?   Nose: Nose normal.  ?Cardiovascular:  ?   Rate and Rhythm: Normal rate and regular rhythm.  ?   Heart sounds: Normal heart sounds. No murmur heard. ?  No friction rub. No gallop.  ?Pulmonary:  ?   Effort: No respiratory distress.  ?   Breath sounds: Normal breath sounds. No stridor. No wheezing, rhonchi or rales.  ?Abdominal:  ?   General: There is no distension.  ?   Palpations: Abdomen is soft.  ?   Tenderness: There is no abdominal tenderness. There is no right CVA tenderness, left CVA tenderness, guarding or rebound.  ?   Hernia: A hernia is present. Hernia is present in the right inguinal area. There is no hernia in the left inguinal area.  ?Genitourinary: ?   Pubic Area: No rash.   ?   Penis: Normal and circumcised. No tenderness, discharge, swelling or lesions.   ?   Comments: Inguinal hernia extending into the right scrotum with diffuse tenderness. No overlying erythema or color  change. ?Musculoskeletal:     ?   General: No deformity.  ?   Cervical back: Neck supple.  ?Neurological:  ?   General: No focal deficit present.  ?   Mental Status: He is alert and oriented to person, place, and time.  ? ? ?ED Results / Procedures / Treatments   ?Labs ?(all labs ordered are listed, but only abnormal results are displayed) ?Labs Reviewed  ?COMPREHENSIVE METABOLIC PANEL - Abnormal; Notable for the following components:  ?    Result Value  ? Glucose, Bld 118 (*)   ? ALT 45 (*)   ? All other components within normal limits  ?CBC WITH DIFFERENTIAL/PLATELET  ? ? ?EKG ?None ? ?Radiology ?No results found. ? ?Procedures ?Procedures  ? ? ?Medications Ordered in ED ?Medications  ?morphine (PF) 4 MG/ML injection 4 mg (4 mg  Intravenous Not Given 11/01/21 2005)  ?oxyCODONE-acetaminophen (PERCOCET/ROXICET) 5-325 MG per tablet 1 tablet (1 tablet Oral Given 11/01/21 1630)  ?diazepam (VALIUM) tablet 5 mg (5 mg Oral Given 11/01/21 1726)  ?HYDROmorphone (DILAUDID) injection 1 mg (1 mg Intravenous Given 11/01/21 1823)  ?ondansetron [facility]) injection 4 mg (4 mg Intravenous Given 11/01/21 1826)  ? ? ?ED Course/ Medical Decision Making/ A&P ?  ?                        ?Medical Decision Making ?Risk ?Prescription drug management. ? ? ?72-year-old male with a history of hypertension presenting to the ED with right groin swelling and pain. ? ?On exam, the patient is afebrile and hemodynamically stable.  He has swelling over the right inguinal crease with a palpable hernia that extends into the right scrotum with diffuse tenderness over the area.  The area is soft and there is no other overlying skin/color changes. ? ?His presentation is most consistent with an inguinal hernia.  Abdomen is soft and nontender otherwise so doubt intra-abdominal pathology. ? ?Bedside reduction of the hernia was attempted with p.o. oxycodone and Valium but patient was unable to tolerate so he was given IV Dilaudid for further pain control. ? ?After IV pain medication, patient continues to endorse severe pain and is unable to tolerate reduction.  I spoke with general surgery who recommended obtaining a CT abdomen/pelvis with contrast for further evaluation. ? ?On reevaluation, patient has had significant improvement in his pain and successful reduction of the inguinal hernia was performed.  Patient is now up and walking around the room requesting discharge and states that his pain is completely resolved.  Given his pain is completely resolved and he has not had recurrence of the hernia, CT abdomen/pelvis was canceled.  Patient was provided the phone number for general surgery to schedule appoint within the next week. Strict return precautions were discussed the patient was  discharged home in stable condition. ? ?Final Clinical Impression(s) / ED Diagnoses ?Final diagnoses:  ?Unilateral recurrent inguinal hernia without obstruction or gangrene  ? ? ?Rx / DC Orders ?ED Discharge Orders   ? ? None  ? ?  ? ? ?  ?Sondra Come, MD ?11/01/21 2344 ? ?  ?Lennice Sites, DO ?11/01/21 2345 ? ?

## 2021-11-01 NOTE — ED Triage Notes (Signed)
Pt endorses groin pain and swelling for 3-4 days.  ?

## 2021-11-05 ENCOUNTER — Ambulatory Visit: Payer: Self-pay | Attending: Internal Medicine | Admitting: Pharmacist

## 2021-11-05 ENCOUNTER — Encounter: Payer: Self-pay | Admitting: Pharmacist

## 2021-11-05 VITALS — BP 127/86

## 2021-11-05 DIAGNOSIS — I10 Essential (primary) hypertension: Secondary | ICD-10-CM

## 2021-11-05 NOTE — Progress Notes (Signed)
? ?  S:    ? ?No chief complaint on file. ? ? ?Duane Lambert is a 61 y.o. male who presents for hypertension evaluation, education, and management. PMH is significant for HTN, HL, elev PSA, tob dep. ?Patient was referred and last seen by Primary Care Provider, Dr. Wynetta Emery, on 09/20/2021. BP was elevated at that appt, however, he had not taken his lisinopril that day. ? ?Today, patient arrives in good spirits and presents without assistance. Denies dizziness, headache, blurred vision, swelling. Of note, he has concerns for ED. Has trouble obtaining an erection. He attributes this to lisinopril but recognizes his need for improved BP control. He also has a history of an elevated PSA. We are in the process of getting him approved for CAFA so he can be referred to a Urologist. ? ?Patient reports hypertension is longstanding.  ? ?Family/Social history:  ?-Fhx: none ?-Tobacco: current 1 PPD smoker  ?-Alcohol: none reported  ? ?Medication adherence reported. Patient has taken BP medications today.  ? ?Current antihypertensives include: lisinopril 20 mg daily  ? ?Reported home BP readings: none ? ?Patient reported dietary habits:  ?-Endorses compliance with sodium restriction ?- "I don't do salt" ? ?Patient-reported exercise habits:  ?-None outside of work ? ?O:  ?Vitals:  ? 11/05/21 1004  ?BP: 127/86  ? ?Last 3 Office BP readings: ?BP Readings from Last 3 Encounters:  ?11/05/21 127/86  ?11/01/21 (!) 147/101  ?09/20/21 (!) 130/100  ? ?BMET ?   ?Component Value Date/Time  ? NA 135 11/01/2021 1630  ? NA 140 07/27/2021 1247  ? K 3.8 11/01/2021 1630  ? CL 100 11/01/2021 1630  ? CO2 27 11/01/2021 1630  ? GLUCOSE 118 (H) 11/01/2021 1630  ? BUN 11 11/01/2021 1630  ? BUN 12 07/27/2021 1247  ? CREATININE 0.81 11/01/2021 1630  ? CALCIUM 9.3 11/01/2021 1630  ? GFRNONAA >60 11/01/2021 1630  ? GFRAA >60 07/25/2017 1539  ? ? ?Renal function: ?Estimated Creatinine Clearance: 90.7 mL/min (by C-G formula based on SCr of 0.81  mg/dL). ? ?Clinical ASCVD: No  ?The 10-year ASCVD risk score (Arnett DK, et al., 2019) is: 23.4% ?  Values used to calculate the score: ?    Age: 34 years ?    Sex: Male ?    Is Non-Hispanic African American: Yes ?    Diabetic: No ?    Tobacco smoker: Yes ?    Systolic Blood Pressure: 357 mmHg ?    Is BP treated: Yes ?    HDL Cholesterol: 42 mg/dL ?    Total Cholesterol: 220 mg/dL ? ?A/P: ?Hypertension longstanding currently close to goal on current medications. BP goal < 130/80 mmHg. Medication adherence appears appropriate.  ?-Continued lisinopril 20 mg daily for now.  ?-Gave patient financial packet to fill our for CAFA approval.  ?-Will send message to PCP for GU concerns. ?-F/u labs ordered - none ?-Counseled on lifestyle modifications for blood pressure control including reduced dietary sodium, increased exercise, adequate sleep. ?-Encouraged patient to check BP at home and bring log of readings to next visit. Counseled on proper use of home BP cuff.  ? ?Results reviewed and written information provided. Patient verbalized understanding of treatment plan. Total time in face-to-face counseling 30 minutes.  ? ?F/u clinic visit with PCP. ? ?Benard Halsted, PharmD, BCACP, CPP ?Clinical Pharmacist ?[place] ?[phone] ? ?

## 2021-11-06 ENCOUNTER — Telehealth: Payer: Self-pay | Admitting: Internal Medicine

## 2021-11-06 NOTE — Telephone Encounter (Signed)
-----   Message from Tresa Endo, RPH-CPP sent at 11/05/2021 10:17 AM EDT ----- ?Hey Dr. Wynetta Emery,  ? ?Saw this pt for a BP check and it was pretty good today. He took his lisinopril.  ? ?With that being said, he voiced concern for ED. I told him I could not prescribed a PDE5 inh for him that he would need to speak with you. Additionally, this is a guy who just established with you and has a history of an elevated PSA. I made sure he left with a CAFA application today so we can get him in with Urology. I told him the Urology could also provide input if and when a PDE5 inh is appropriate for him.  ? ?I told him I would let you know of his concerns.  ? ?Thanks for including me. ? Lurena Joiner ? ? ?

## 2021-11-08 NOTE — Telephone Encounter (Signed)
Apt scheduled for 11/12/2021 at 1330, Mychart.  ?

## 2021-11-12 ENCOUNTER — Ambulatory Visit: Payer: Self-pay | Attending: Internal Medicine | Admitting: Internal Medicine

## 2021-11-12 DIAGNOSIS — Z91199 Patient's noncompliance with other medical treatment and regimen due to unspecified reason: Secondary | ICD-10-CM

## 2021-11-12 NOTE — Progress Notes (Signed)
This was supposed to be a telephone visit at 1:30 PM.  Phone call placed to patient at 1:25 PM.  I got an automated voice message.  I did not leave a message.  I called again at 1:35 and again got no answer.  I received message from [facility] about 10 minutes later that pt had called back wanting to do the visit.  By that time I was in with another patient.  Told him that I will call him if time permits today which it did not.  We will have him rescheduled.

## 2021-11-30 ENCOUNTER — Other Ambulatory Visit: Payer: Self-pay

## 2021-12-01 ENCOUNTER — Other Ambulatory Visit: Payer: Self-pay

## 2021-12-03 ENCOUNTER — Other Ambulatory Visit: Payer: Self-pay | Admitting: Pharmacist

## 2021-12-03 ENCOUNTER — Other Ambulatory Visit: Payer: Self-pay

## 2021-12-03 NOTE — Chronic Care Management (AMB) (Signed)
Patient seen by Camila Li, PharmD Candidate on 12/03/21 while they were picking up prescriptions at [facility].   Patient does not have an automated home blood pressure machine.   Medication review was performed. They are not taking medications as prescribed - occasionally forgetting lisinopril  The following barriers to adherence were noted: - Forgetting to take medications - discussed adherence. Plan to start taking at night after dinner  The following interventions were completed:  - Medications were reviewed - Patient was educated on medications, including indication and administration - Patient was educated on use of adherence strategies, like a pill box or alarms - Patient was educated on proper technique to check home blood pressure and reminded to bring home machine and readings to next provider appointment  The patient has follow up scheduled:  PCP: 12/24/21   Catie Hedwig Morton, PharmD, [place] [phone]

## 2021-12-22 ENCOUNTER — Encounter (HOSPITAL_COMMUNITY): Payer: Self-pay | Admitting: Emergency Medicine

## 2021-12-22 ENCOUNTER — Encounter (HOSPITAL_COMMUNITY)
Admission: EM | Disposition: A | Discharge status: Home / Self Care | Payer: Self-pay | Source: Home / Self Care | Attending: Emergency Medicine

## 2021-12-22 ENCOUNTER — Other Ambulatory Visit: Payer: Self-pay

## 2021-12-22 ENCOUNTER — Emergency Department (EMERGENCY_DEPARTMENT_HOSPITAL): Payer: Commercial Managed Care - HMO | Admitting: Anesthesiology

## 2021-12-22 ENCOUNTER — Emergency Department (HOSPITAL_COMMUNITY): Payer: Commercial Managed Care - HMO | Admitting: Anesthesiology

## 2021-12-22 ENCOUNTER — Observation Stay (HOSPITAL_COMMUNITY)
Admission: EM | Admit: 2021-12-22 | Discharge: 2021-12-23 | Disposition: A | Discharge status: Home / Self Care | Payer: Commercial Managed Care - HMO | Attending: General Surgery | Admitting: General Surgery

## 2021-12-22 ENCOUNTER — Emergency Department (HOSPITAL_COMMUNITY): Payer: Commercial Managed Care - HMO

## 2021-12-22 DIAGNOSIS — I10 Essential (primary) hypertension: Secondary | ICD-10-CM | POA: Insufficient documentation

## 2021-12-22 DIAGNOSIS — K4030 Unilateral inguinal hernia, with obstruction, without gangrene, not specified as recurrent: Principal | ICD-10-CM | POA: Insufficient documentation

## 2021-12-22 DIAGNOSIS — R109 Unspecified abdominal pain: Secondary | ICD-10-CM | POA: Diagnosis present

## 2021-12-22 DIAGNOSIS — F17210 Nicotine dependence, cigarettes, uncomplicated: Secondary | ICD-10-CM | POA: Diagnosis not present

## 2021-12-22 DIAGNOSIS — K56609 Unspecified intestinal obstruction, unspecified as to partial versus complete obstruction: Secondary | ICD-10-CM

## 2021-12-22 HISTORY — PX: INGUINAL HERNIA REPAIR: SHX194

## 2021-12-22 LAB — LIPASE, BLOOD: Lipase: 20 U/L (ref 11–51)

## 2021-12-22 LAB — TYPE AND SCREEN
ABO/RH(D): A POS
Antibody Screen: NEGATIVE

## 2021-12-22 LAB — COMPREHENSIVE METABOLIC PANEL
ALT: 38 U/L (ref 0–44)
AST: 36 U/L (ref 15–41)
Albumin: 4.3 g/dL (ref 3.5–5.0)
Alkaline Phosphatase: 75 U/L (ref 38–126)
Anion gap: 12 (ref 5–15)
BUN: 12 mg/dL (ref 6–20)
CO2: 22 mmol/L (ref 22–32)
Calcium: 9.8 mg/dL (ref 8.9–10.3)
Chloride: 102 mmol/L (ref 98–111)
Creatinine, Ser: 0.83 mg/dL (ref 0.61–1.24)
GFR, Estimated: >60 mL/min (ref >60)
Glucose, Bld: 177 mg/dL — ABNORMAL HIGH (ref 70–99)
Potassium: 3.7 mmol/L (ref 3.5–5.1)
Sodium: 136 mmol/L (ref 135–145)
Total Bilirubin: 0.5 mg/dL (ref 0.3–1.2)
Total Protein: 8 g/dL (ref 6.5–8.1)

## 2021-12-22 LAB — URINALYSIS, ROUTINE W REFLEX MICROSCOPIC
Bilirubin Urine: NEGATIVE
Glucose, UA: NEGATIVE mg/dL
Ketones, ur: NEGATIVE mg/dL
Nitrite: POSITIVE — AB
Protein, ur: NEGATIVE mg/dL
Specific Gravity, Urine: 1.023 (ref 1.005–1.030)
pH: 5 (ref 5.0–8.0)

## 2021-12-22 LAB — CBC
HCT: 47.9 % (ref 39.0–52.0)
Hemoglobin: 16.1 g/dL (ref 13.0–17.0)
MCH: 29.4 pg (ref 26.0–34.0)
MCHC: 33.6 g/dL (ref 30.0–36.0)
MCV: 87.4 fL (ref 80.0–100.0)
Platelets: 187 10*3/uL (ref 150–400)
RBC: 5.48 MIL/uL (ref 4.22–5.81)
RDW: 14.7 % (ref 11.5–15.5)
WBC: 8.2 10*3/uL (ref 4.0–10.5)
nRBC: 0 % (ref 0.0–0.2)

## 2021-12-22 LAB — LACTIC ACID, PLASMA: Lactic Acid, Venous: 1.4 mmol/L (ref 0.5–1.9)

## 2021-12-22 SURGERY — REPAIR, HERNIA, INGUINAL, ADULT
Anesthesia: General | Site: Groin | Laterality: Right

## 2021-12-22 MED ORDER — ONDANSETRON HCL 4 MG/2ML IJ SOLN: 4.0000 mg | Freq: Once | INTRAMUSCULAR | Status: DC | PRN

## 2021-12-22 MED ORDER — HYDROMORPHONE HCL 1 MG/ML IJ SOLN
1.0000 mg | Freq: Once | INTRAMUSCULAR | Status: AC
  Administered 2021-12-22: 1 mg via INTRAVENOUS
  Filled 2021-12-22: qty 1

## 2021-12-22 MED ORDER — MIDAZOLAM HCL 2 MG/2ML IJ SOLN
INTRAMUSCULAR | Status: AC
  Filled 2021-12-22: qty 2

## 2021-12-22 MED ORDER — ENOXAPARIN SODIUM 40 MG/0.4ML IJ SOSY
40.0000 mg | PREFILLED_SYRINGE | INTRAMUSCULAR | Status: DC
  Administered 2021-12-23: 40 mg via SUBCUTANEOUS
  Filled 2021-12-22: qty 0.4

## 2021-12-22 MED ORDER — OXYCODONE HCL 5 MG PO TABS
5.0000 mg | ORAL_TABLET | ORAL | Status: DC | PRN
  Administered 2021-12-22 – 2021-12-23 (×2): 10 mg via ORAL
  Filled 2021-12-22 (×2): qty 2

## 2021-12-22 MED ORDER — SIMETHICONE 80 MG PO CHEW: 40.0000 mg | CHEWABLE_TABLET | Freq: Four times a day (QID) | ORAL | Status: DC | PRN

## 2021-12-22 MED ORDER — AMISULPRIDE (ANTIEMETIC) 5 MG/2ML IV SOLN: 10.0000 mg | Freq: Once | INTRAVENOUS | Status: DC | PRN

## 2021-12-22 MED ORDER — METOPROLOL TARTRATE 5 MG/5ML IV SOLN
5.0000 mg | Freq: Once | INTRAVENOUS | Status: AC
  Administered 2021-12-22: 5 mg via INTRAVENOUS
  Filled 2021-12-22: qty 5

## 2021-12-22 MED ORDER — PROMETHAZINE HCL 25 MG/ML IJ SOLN: 6.2500 mg | INTRAMUSCULAR | Status: DC | PRN

## 2021-12-22 MED ORDER — LABETALOL HCL 5 MG/ML IV SOLN
INTRAVENOUS | Status: AC
Start: 2021-12-22 — End: ?
  Filled 2021-12-22: qty 4

## 2021-12-22 MED ORDER — DOCUSATE SODIUM 100 MG PO CAPS
100.0000 mg | ORAL_CAPSULE | Freq: Two times a day (BID) | ORAL | Status: DC
Start: 2021-12-22 — End: 2021-12-23
  Administered 2021-12-22 – 2021-12-23 (×2): 100 mg via ORAL
  Filled 2021-12-22 (×2): qty 1

## 2021-12-22 MED ORDER — ACETAMINOPHEN 10 MG/ML IV SOLN
INTRAVENOUS | Status: AC
  Filled 2021-12-22: qty 100

## 2021-12-22 MED ORDER — HYDRALAZINE HCL 20 MG/ML IJ SOLN: 10.0000 mg | INTRAMUSCULAR | Status: DC | PRN

## 2021-12-22 MED ORDER — METHOCARBAMOL 1000 MG/10ML IJ SOLN: 500.0000 mg | Freq: Three times a day (TID) | INTRAVENOUS | Status: DC | PRN

## 2021-12-22 MED ORDER — LISINOPRIL 20 MG PO TABS
20.0000 mg | ORAL_TABLET | Freq: Every day | ORAL | Status: DC
  Administered 2021-12-22 – 2021-12-23 (×2): 20 mg via ORAL
  Filled 2021-12-22 (×2): qty 1

## 2021-12-22 MED ORDER — LACTATED RINGERS IV BOLUS
1000.0000 mL | Freq: Once | INTRAVENOUS | Status: AC
  Administered 2021-12-22: 1000 mL via INTRAVENOUS

## 2021-12-22 MED ORDER — DEXAMETHASONE SODIUM PHOSPHATE 10 MG/ML IJ SOLN
INTRAMUSCULAR | Status: AC
Start: 2021-12-22 — End: ?
  Filled 2021-12-22: qty 1

## 2021-12-22 MED ORDER — PROPOFOL 10 MG/ML IV BOLUS
INTRAVENOUS | Status: DC | PRN
  Administered 2021-12-22: 200 mg via INTRAVENOUS

## 2021-12-22 MED ORDER — ACETAMINOPHEN 325 MG PO TABS: 650.0000 mg | ORAL_TABLET | Freq: Four times a day (QID) | ORAL | Status: DC | PRN

## 2021-12-22 MED ORDER — BUPIVACAINE-EPINEPHRINE (PF) 0.25% -1:200000 IJ SOLN
INTRAMUSCULAR | Status: AC
  Filled 2021-12-22: qty 30

## 2021-12-22 MED ORDER — OXYCODONE HCL 5 MG PO TABS: 5.0000 mg | ORAL_TABLET | Freq: Once | ORAL | Status: DC | PRN

## 2021-12-22 MED ORDER — MEPERIDINE HCL 25 MG/ML IJ SOLN: 6.2500 mg | INTRAMUSCULAR | Status: DC | PRN

## 2021-12-22 MED ORDER — OXYCODONE HCL 5 MG/5ML PO SOLN: 5.0000 mg | Freq: Once | ORAL | Status: DC | PRN

## 2021-12-22 MED ORDER — ONDANSETRON HCL 4 MG/2ML IJ SOLN
INTRAMUSCULAR | Status: DC | PRN
  Administered 2021-12-22 (×2): 4 mg via INTRAVENOUS

## 2021-12-22 MED ORDER — HYDROMORPHONE HCL 1 MG/ML IJ SOLN
1.0000 mg | INTRAMUSCULAR | Status: DC | PRN
  Administered 2021-12-22 – 2021-12-23 (×2): 1 mg via INTRAVENOUS
  Filled 2021-12-22 (×2): qty 1

## 2021-12-22 MED ORDER — POLYETHYLENE GLYCOL 3350 17 G PO PACK
17.0000 g | PACK | Freq: Every day | ORAL | Status: DC | PRN
Start: 2021-12-22 — End: 2021-12-23
  Administered 2021-12-23: 17 g via ORAL
  Filled 2021-12-22: qty 1

## 2021-12-22 MED ORDER — DEXAMETHASONE SODIUM PHOSPHATE 10 MG/ML IJ SOLN
INTRAMUSCULAR | Status: DC | PRN
  Administered 2021-12-22: 10 mg via INTRAVENOUS

## 2021-12-22 MED ORDER — DIPHENHYDRAMINE HCL 50 MG/ML IJ SOLN: 12.5000 mg | Freq: Four times a day (QID) | INTRAMUSCULAR | Status: DC | PRN

## 2021-12-22 MED ORDER — CHLORHEXIDINE GLUCONATE 0.12 % MT SOLN
OROMUCOSAL | Status: AC
  Filled 2021-12-22: qty 15

## 2021-12-22 MED ORDER — ONDANSETRON 4 MG PO TBDP: 4.0000 mg | ORAL_TABLET | Freq: Four times a day (QID) | ORAL | Status: DC | PRN

## 2021-12-22 MED ORDER — ORAL CARE MOUTH RINSE: 15.0000 mL | Freq: Once | OROMUCOSAL | Status: DC

## 2021-12-22 MED ORDER — PROPOFOL 10 MG/ML IV BOLUS
INTRAVENOUS | Status: AC
  Filled 2021-12-22: qty 20

## 2021-12-22 MED ORDER — ONDANSETRON HCL 4 MG/2ML IJ SOLN
4.0000 mg | Freq: Once | INTRAMUSCULAR | Status: AC
  Administered 2021-12-22: 4 mg via INTRAVENOUS
  Filled 2021-12-22: qty 2

## 2021-12-22 MED ORDER — DIPHENHYDRAMINE HCL 12.5 MG/5ML PO ELIX: 12.5000 mg | ORAL_SOLUTION | Freq: Four times a day (QID) | ORAL | Status: DC | PRN

## 2021-12-22 MED ORDER — FENTANYL CITRATE (PF) 250 MCG/5ML IJ SOLN
INTRAMUSCULAR | Status: DC | PRN
  Administered 2021-12-22: 50 ug via INTRAVENOUS
  Administered 2021-12-22: 100 ug via INTRAVENOUS

## 2021-12-22 MED ORDER — CHLORHEXIDINE GLUCONATE 0.12 % MT SOLN: 15.0000 mL | Freq: Once | OROMUCOSAL | Status: DC

## 2021-12-22 MED ORDER — ACETAMINOPHEN 650 MG RE SUPP: 650.0000 mg | Freq: Four times a day (QID) | RECTAL | Status: DC | PRN

## 2021-12-22 MED ORDER — PANTOPRAZOLE SODIUM 40 MG IV SOLR
40.0000 mg | Freq: Every day | INTRAVENOUS | Status: DC
Start: 2021-12-22 — End: 2021-12-23
  Administered 2021-12-22: 40 mg via INTRAVENOUS
  Filled 2021-12-22: qty 10

## 2021-12-22 MED ORDER — ONDANSETRON HCL 4 MG/2ML IJ SOLN
INTRAMUSCULAR | Status: AC
  Filled 2021-12-22: qty 2

## 2021-12-22 MED ORDER — METHOCARBAMOL 500 MG PO TABS: 500.0000 mg | ORAL_TABLET | Freq: Three times a day (TID) | ORAL | Status: DC | PRN

## 2021-12-22 MED ORDER — HYDROMORPHONE HCL 1 MG/ML IJ SOLN: 0.2500 mg | INTRAMUSCULAR | Status: DC | PRN

## 2021-12-22 MED ORDER — ROCURONIUM BROMIDE 10 MG/ML (PF) SYRINGE
PREFILLED_SYRINGE | INTRAVENOUS | Status: DC | PRN
  Administered 2021-12-22: 20 mg via INTRAVENOUS
  Administered 2021-12-22: 80 mg via INTRAVENOUS

## 2021-12-22 MED ORDER — SODIUM CHLORIDE 0.9 % IV SOLN: INTRAVENOUS | Status: DC

## 2021-12-22 MED ORDER — BUPIVACAINE HCL (PF) 0.25 % IJ SOLN
INTRAMUSCULAR | Status: AC
  Filled 2021-12-22: qty 30

## 2021-12-22 MED ORDER — 0.9 % SODIUM CHLORIDE (POUR BTL) OPTIME
TOPICAL | Status: DC | PRN
  Administered 2021-12-22: 1000 mL

## 2021-12-22 MED ORDER — MIDAZOLAM HCL 2 MG/2ML IJ SOLN
INTRAMUSCULAR | Status: DC | PRN
  Administered 2021-12-22: 2 mg via INTRAVENOUS

## 2021-12-22 MED ORDER — LACTATED RINGERS IV SOLN: INTRAVENOUS | Status: DC

## 2021-12-22 MED ORDER — SUGAMMADEX SODIUM 200 MG/2ML IV SOLN
INTRAVENOUS | Status: DC | PRN
  Administered 2021-12-22: 200 mg via INTRAVENOUS

## 2021-12-22 MED ORDER — LABETALOL HCL 5 MG/ML IV SOLN
INTRAVENOUS | Status: DC | PRN
  Administered 2021-12-22 (×2): 5 mg via INTRAVENOUS

## 2021-12-22 MED ORDER — ACETAMINOPHEN 10 MG/ML IV SOLN
INTRAVENOUS | Status: DC | PRN
  Administered 2021-12-22: 1000 mg via INTRAVENOUS

## 2021-12-22 MED ORDER — IOHEXOL 350 MG/ML SOLN
100.0000 mL | Freq: Once | INTRAVENOUS | Status: AC | PRN
  Administered 2021-12-22: 100 mL via INTRAVENOUS

## 2021-12-22 MED ORDER — CEFAZOLIN SODIUM-DEXTROSE 2-4 GM/100ML-% IV SOLN
2.0000 g | INTRAVENOUS | Status: AC
  Administered 2021-12-22: 2 g via INTRAVENOUS
  Filled 2021-12-22: qty 100

## 2021-12-22 MED ORDER — FENTANYL CITRATE (PF) 250 MCG/5ML IJ SOLN
INTRAMUSCULAR | Status: AC
  Filled 2021-12-22: qty 5

## 2021-12-22 MED ORDER — ONDANSETRON HCL 4 MG/2ML IJ SOLN: 4.0000 mg | Freq: Four times a day (QID) | INTRAMUSCULAR | Status: DC | PRN

## 2021-12-22 MED ORDER — BUPIVACAINE-EPINEPHRINE 0.25% -1:200000 IJ SOLN
INTRAMUSCULAR | Status: DC | PRN
  Administered 2021-12-22: 20 mL

## 2021-12-22 MED ORDER — DEXMEDETOMIDINE (PRECEDEX) IN NS 20 MCG/5ML (4 MCG/ML) IV SYRINGE
PREFILLED_SYRINGE | INTRAVENOUS | Status: DC | PRN
  Administered 2021-12-22: 4 ug via INTRAVENOUS
  Administered 2021-12-22 (×2): 8 ug via INTRAVENOUS

## 2021-12-22 MED ORDER — PHENYLEPHRINE 80 MCG/ML (10ML) SYRINGE FOR IV PUSH (FOR BLOOD PRESSURE SUPPORT)
PREFILLED_SYRINGE | INTRAVENOUS | Status: DC | PRN
  Administered 2021-12-22: 80 ug via INTRAVENOUS
  Administered 2021-12-22 (×2): 160 ug via INTRAVENOUS
  Administered 2021-12-22: 120 ug via INTRAVENOUS

## 2021-12-22 MED ORDER — PHENYLEPHRINE HCL-NACL 20-0.9 MG/250ML-% IV SOLN
INTRAVENOUS | Status: DC | PRN
  Administered 2021-12-22: 35 ug/min via INTRAVENOUS

## 2021-12-22 MED ORDER — LIDOCAINE 2% (20 MG/ML) 5 ML SYRINGE
INTRAMUSCULAR | Status: DC | PRN
  Administered 2021-12-22: 80 mg via INTRAVENOUS

## 2021-12-22 SURGICAL SUPPLY — 43 items
ADH SKN CLS APL DERMABOND .7 (GAUZE/BANDAGES/DRESSINGS) ×1
APL PRP STRL LF DISP 70% ISPRP (MISCELLANEOUS) ×1
BAG COUNTER SPONGE SURGICOUNT (BAG) ×3 IMPLANT
BAG SPNG CNTER NS LX DISP (BAG) ×1
BLADE CLIPPER SURG (BLADE) ×1 IMPLANT
CANISTER SUCT 3000ML PPV (MISCELLANEOUS) ×1 IMPLANT
CHLORAPREP W/TINT 26 (MISCELLANEOUS) ×3 IMPLANT
COVER SURGICAL LIGHT HANDLE (MISCELLANEOUS) ×3 IMPLANT
DERMABOND ADVANCED (GAUZE/BANDAGES/DRESSINGS) ×1
DERMABOND ADVANCED .7 DNX12 (GAUZE/BANDAGES/DRESSINGS) ×2 IMPLANT
DRAIN PENROSE 1/2X12 LTX STRL (WOUND CARE) ×1 IMPLANT
DRAPE LAPAROTOMY 100X72 PEDS (DRAPES) ×2 IMPLANT
ELECT REM PT RETURN 9FT ADLT (ELECTROSURGICAL) ×2
ELECTRODE REM PT RTRN 9FT ADLT (ELECTROSURGICAL) ×2 IMPLANT
GLOVE BIO SURGEON STRL SZ 6.5 (GLOVE) ×1 IMPLANT
GLOVE BIO SURGEON STRL SZ8 (GLOVE) ×3 IMPLANT
GLOVE BIOGEL PI IND STRL 7.0 (GLOVE) IMPLANT
GLOVE BIOGEL PI IND STRL 8 (GLOVE) ×2 IMPLANT
GLOVE BIOGEL PI INDICATOR 7.0 (GLOVE) ×2
GLOVE BIOGEL PI INDICATOR 8 (GLOVE) ×1
GOWN STRL REUS W/ TWL LRG LVL3 (GOWN DISPOSABLE) ×2 IMPLANT
GOWN STRL REUS W/ TWL XL LVL3 (GOWN DISPOSABLE) ×2 IMPLANT
GOWN STRL REUS W/TWL LRG LVL3 (GOWN DISPOSABLE) ×2
GOWN STRL REUS W/TWL XL LVL3 (GOWN DISPOSABLE) ×2
KIT BASIN OR (CUSTOM PROCEDURE TRAY) ×3 IMPLANT
KIT TURNOVER KIT B (KITS) ×3 IMPLANT
MESH HERNIA 3X6 (Mesh General) ×1 IMPLANT
NEEDLE 22X1 1/2 (OR ONLY) (NEEDLE) ×3 IMPLANT
NS IRRIG 1000ML POUR BTL (IV SOLUTION) ×3 IMPLANT
PACK GENERAL/GYN (CUSTOM PROCEDURE TRAY) ×3 IMPLANT
PAD ARMBOARD 7.5X6 YLW CONV (MISCELLANEOUS) ×3 IMPLANT
PENCIL SMOKE EVACUATOR (MISCELLANEOUS) ×2 IMPLANT
SPECIMEN JAR SMALL (MISCELLANEOUS) IMPLANT
SUT MNCRL AB 4-0 PS2 18 (SUTURE) ×3 IMPLANT
SUT PROLENE 2 0 CT2 30 (SUTURE) ×9 IMPLANT
SUT VIC AB 2-0 SH 27 (SUTURE) ×2
SUT VIC AB 2-0 SH 27X BRD (SUTURE) ×2 IMPLANT
SUT VIC AB 3-0 SH 27 (SUTURE) ×2
SUT VIC AB 3-0 SH 27X BRD (SUTURE) ×2 IMPLANT
SUT VICRYL AB 3 0 TIES (SUTURE) IMPLANT
SYR CONTROL 10ML LL (SYRINGE) ×3 IMPLANT
TOWEL GREEN STERILE (TOWEL DISPOSABLE) ×3 IMPLANT
TOWEL GREEN STERILE FF (TOWEL DISPOSABLE) ×3 IMPLANT

## 2021-12-22 NOTE — Anesthesia Postprocedure Evaluation (Signed)
Anesthesia Post Note  Patient: Duane Lambert  Procedure(s) Performed: OPEN  INGUINAL HERNIA REPAIR POSSIBLE LAPAROTOMY (Right: Groin)     Patient location during evaluation: PACU Anesthesia Type: General Level of consciousness: awake and alert Pain management: pain level controlled Vital Signs Assessment: post-procedure vital signs reviewed and stable Respiratory status: spontaneous breathing, nonlabored ventilation, respiratory function stable and patient connected to nasal cannula oxygen Cardiovascular status: blood pressure returned to baseline and stable Postop Assessment: no apparent nausea or vomiting Anesthetic complications: no   No notable events documented.  Last Vitals:  Vitals:   12/22/21 1550 12/22/21 1612  BP: (!) 156/104 (!) 159/87  Pulse: 74 69  Resp: 15 16  Temp: 36.5 C 36.6 C  SpO2: 93%     Last Pain:  Vitals:   12/22/21 1612  TempSrc: Oral  PainSc:                  Santa Lighter

## 2021-12-22 NOTE — ED Provider Notes (Signed)
[facility] EMERGENCY DEPARTMENT Provider Note   CSN: [id number] Arrival date & time: 12/22/21  7026     History  Chief Complaint  Patient presents with   Abdominal Pain    Duane Lambert is a 61 y.o. male.  Patient with a history of hypertension, previous appendectomy, right inguinal hernia presenting with diffuse abdominal pain onset last evening.  Pain is constant preventing her from sleeping.  States the pain is diffuse in his abdomen associate with multiple episodes of nausea and vomiting.  He saw some red streaks in his emesis mixed with clear fluid and he is concerned that it could be blood.  Denies any black or bloody stools.  Denies any fevers.  Believes he has been constipated with last bowel movement yesterday.  States his pain is different when he had his ED visit for hernia last month.  He did not follow-up with the surgeon. He denies any chest pain or shortness of breath.  No abdominal pain or back pain.  Does have some dysuria.  No hematuria.  The history is provided by the patient.  Abdominal Pain Associated symptoms: dysuria, nausea and vomiting   Associated symptoms: no cough, no fever and no shortness of breath        Home Medications Prior to Admission medications   Medication Sig Start Date End Date Taking? Authorizing Provider  atorvastatin (LIPITOR) 10 MG tablet Take 1 tablet (10 mg total) by mouth daily. 09/20/21   Ladell Pier, MD  lisinopril (ZESTRIL) 20 MG tablet Take 1 tablet (20 mg total) by mouth daily. 10/29/21   Charlott Rakes, MD      Allergies    Patient has no known allergies.    Review of Systems   Review of Systems  Constitutional:  Positive for activity change and appetite change. Negative for fever.  HENT:  Negative for congestion and rhinorrhea.   Respiratory:  Negative for cough, chest tightness and shortness of breath.   Gastrointestinal:  Positive for abdominal pain, nausea and vomiting.  Genitourinary:   Positive for dysuria and scrotal swelling. Negative for frequency, testicular pain and urgency.  Musculoskeletal:  Negative for arthralgias and myalgias.  Skin:  Negative for rash.  Neurological:  Negative for dizziness, weakness and headaches.   all other systems are negative except as noted in the HPI and PMH.    Physical Exam Updated Vital Signs BP (!) 158/116   Pulse 77   Temp 98.1 F (36.7 C) (Oral)   Resp 17   SpO2 100%  Physical Exam Vitals and nursing note reviewed.  Constitutional:      General: He is not in acute distress.    Appearance: He is well-developed.  HENT:     Head: Normocephalic and atraumatic.     Mouth/Throat:     Pharynx: No oropharyngeal exudate.  Eyes:     Conjunctiva/sclera: Conjunctivae normal.     Pupils: Pupils are equal, round, and reactive to light.  Neck:     Comments: No meningismus. Cardiovascular:     Rate and Rhythm: Normal rate and regular rhythm.     Heart sounds: Normal heart sounds. No murmur heard. Pulmonary:     Effort: Pulmonary effort is normal. No respiratory distress.     Breath sounds: Normal breath sounds.  Abdominal:     Palpations: Abdomen is soft.     Tenderness: There is abdominal tenderness. There is guarding. There is no rebound.     Comments: Diffuse tenderness with  voluntary guarding.  Genitourinary:    Comments: Right inguinal hernia extends into the scrotum.  It is hard and not able to be reduced.  Quite tender to palpation.  Testicle itself is nontender Musculoskeletal:        General: No tenderness. Normal range of motion.     Cervical back: Normal range of motion and neck supple.  Skin:    General: Skin is warm.  Neurological:     Mental Status: He is alert and oriented to person, place, and time.     Cranial Nerves: No cranial nerve deficit.     Motor: No abnormal muscle tone.     Coordination: Coordination normal.     Comments:  5/5 strength throughout. CN 2-12 intact.Equal grip strength.    Psychiatric:        Behavior: Behavior normal.     ED Results / Procedures / Treatments   Labs (all labs ordered are listed, but only abnormal results are displayed) Labs Reviewed  COMPREHENSIVE METABOLIC PANEL - Abnormal; Notable for the following components:      Result Value   Glucose, Bld 177 (*)    All other components within normal limits  URINALYSIS, ROUTINE W REFLEX MICROSCOPIC - Abnormal; Notable for the following components:   APPearance HAZY (*)    Hgb urine dipstick SMALL (*)    Nitrite POSITIVE (*)    Leukocytes,Ua MODERATE (*)    Bacteria, UA MANY (*)    All other components within normal limits  URINE CULTURE  LIPASE, BLOOD  CBC  LACTIC ACID, PLASMA  LACTIC ACID, PLASMA    EKG None  Radiology CT ABDOMEN PELVIS W CONTRAST  Result Date: 12/22/2021 CLINICAL DATA:  Abdominal pain, acute, nonlocalized abdominal pain, vomiting, R inguinal hernia EXAM: CT ABDOMEN AND PELVIS WITH CONTRAST TECHNIQUE: Multidetector CT imaging of the abdomen and pelvis was performed using the standard protocol following bolus administration of intravenous contrast. RADIATION DOSE REDUCTION: This exam was performed according to the departmental dose-optimization program which includes automated exposure control, adjustment of the mA and/or kV according to patient size and/or use of iterative reconstruction technique. CONTRAST:  19m OMNIPAQUE IOHEXOL 350 MG/ML SOLN COMPARISON:  None Available. FINDINGS: Lower chest: No acute abnormality. Hepatobiliary: No focal liver abnormality is seen. No gallstones, gallbladder wall thickening, or biliary dilatation. Pancreas: Unremarkable. No pancreatic ductal dilatation or surrounding inflammatory changes. Spleen: Unremarkable. Adrenals/Urinary Tract: 1.5 cm right adrenal nodule. Kidneys are unremarkable. Bladder is partially distended and slightly extends into the right inguinal hernia. Stomach/Bowel: Stomach is within normal limits. There is dilatation  of fluid-filled distal small bowel loops with transition point within the right inguinal hernia. The loop within the hernia demonstrates wall thickening. Distal ileum beyond this point is normal in caliber. Colon is normal in caliber. Vascular/Lymphatic: Atherosclerosis.  No enlarged nodes. Reproductive: Unremarkable. Other: Partially imaged free fluid in the right inguinal hernia. Musculoskeletal: No acute osseous abnormality. IMPRESSION: Distal small bowel obstruction with transition point within a right inguinal hernia. The loop within the hernia demonstrates wall thickening and there is adjacent fluid raising the possibility of strangulation. A small portion of bladder also extends into the hernia. Indeterminate 1.5 cm right adrenal nodule. Recommend nonemergent evaluation with adrenal protocol CT. Initial results were called by telephone at the time of interpretation on 12/22/2021 at 10:01 am to provider [facility], who verbally acknowledged these results. Electronically Signed   By: PMacy MisM.D.   On: 12/22/2021 10:01     Procedures .Critical  Care  Performed by: Ezequiel Essex, MD Authorized by: Ezequiel Essex, MD   Critical care provider statement:    Critical care time (minutes):  45   Critical care time was exclusive of:  Separately billable procedures and treating other patients   Critical care was necessary to treat or prevent imminent or life-threatening deterioration of the following conditions: Incarcerated and strangulated inguinal hernia with bowel obstruction.   Critical care was time spent personally by me on the following activities:  Development of treatment plan with patient or surrogate, discussions with consultants, evaluation of patient's response to treatment, examination of patient, ordering and review of laboratory studies, ordering and review of radiographic studies, ordering and performing treatments and interventions, pulse oximetry, re-evaluation of  patient's condition and review of old charts   I assumed direction of critical care for this patient from another provider in my specialty: no     Care discussed with: admitting provider       Medications Ordered in ED Medications  lactated ringers bolus 1,000 mL (has no administration in time range)  ondansetron (ZOFRAN) injection 4 mg (has no administration in time range)  HYDROmorphone (DILAUDID) injection 1 mg (has no administration in time range)    ED Course/ Medical Decision Making/ A&P                           Medical Decision Making Amount and/or Complexity of Data Reviewed Labs: ordered. Decision-making details documented in ED Course. Radiology: ordered and independent interpretation performed. Decision-making details documented in ED Course. ECG/medicine tests: ordered and independent interpretation performed. Decision-making details documented in ED Course.  Risk Prescription drug management. Decision regarding hospitalization.  Diffuse abdominal pain with nausea and vomiting.  Some blood streaks in his emesis is questionable.  Denies any black or bloody stools.  Diffuse abdominal tenderness with voluntary guarding. Does have right inguinal hernia which is unable to be reduced.  Will give pain control and attempt again  Urinalysis is concerning for infection.  Culture sent and antibiotics started.  Unable to reduce hernia after pain and nausea medications.  Patient sent for CT scan. Discussed with Dr. Grandville Silos of general surgery who agrees with CT scan and will evaluate patient  CT scan is concerning for incarcerated and possibly strangulated right inguinal hernia with small bowel obstruction.  Results reviewed and interpreted by me.  Attempted to place NG tube.  Patient pulled this out and was not able to tolerate it.  Refused to have it replaced.  CT results discussed again with the general surgery team.  Patient will likely need surgical  exploration.       Final Clinical Impression(s) / ED Diagnoses Final diagnoses:  Incarcerated right inguinal hernia  Small bowel obstruction [facility])    Rx / DC Orders ED Discharge Orders     None         Evin Chirco, Annie Main, MD 12/22/21 1112

## 2021-12-22 NOTE — Op Note (Signed)
  12/22/2021  3:01 PM  PATIENT:  Duane Lambert  61 y.o. male  PRE-OPERATIVE DIAGNOSIS:  INCARCERATED RIGHT INQUINAL HERNIA  POST-OPERATIVE DIAGNOSIS:  INCARCERATED RIGHT INQUINAL HERNIA  PROCEDURE:  Procedure(s): OPEN  INGUINAL HERNIA REPAIR WITH MESH  SURGEON:  Surgeon(s): Georganna Skeans, MD  ASSISTANTS: Margie Billet, PA-C   ANESTHESIA:   local and general  EBL:  Total I/O In: 1100 [I.V.:1000; IV Piggyback:100] Out: 25 [Blood:25]  BLOOD ADMINISTERED:none  DRAINS: none   SPECIMEN:  No Specimen  DISPOSITION OF SPECIMEN:  N/A  COUNTS:  YES  DICTATION: .Dragon Dictation Procedure in detail: Informed consent was obtained.  He received intravenous antibiotics.  He was brought to the operating room and general endotracheal anesthesia was administered by the anesthesia staff.  On examination his hernia easily reduced.  His abdomen and groins were prepped and draped in a sterile fashion.  Timeout procedure was performed.  I injected local in the right inguinal region.  Right inguinal incision was made.  Subcutaneous tissues were dissected down through Scarpa's fascia revealing the external bleak fascia.  This was divided sharply out laterally.  The division was given carried medially down through the external ring.  The superior leaflet was dissected free off the transversalis and the inferior leaflet was dissected down revealing the shelving edge of the inguinal ligament.  Cord structures were encircled with a Penrose drain.  I then dissected the cord.  The hernia sac was very adherent to the cord structures.  I was eventually able to free it up and this was a direct hernia.  This easily reduced all back into the abdomen.  Most of his contents had already been reduced.  I then completed the hernia repair with a keyhole polypropylene mesh cut to custom size and shape.  I sutured it to the tissues over the pubic tubercle inferiorly with 2-0 Prolene and I sutured it in a running fashion  inferiorly along the shelving edge of the inguinal ligament.  Superiorly it was tacked down to the transversalis with interrupted 2-0 Prolene.  The 2 leaflets were rejoined behind the cord and tacked together and to the underlying tissues with interrupted 2-0 Prolene.  The aperture and the mesh was left adequate for the cord structures.  The leaflets of the mesh were tucked out laterally underneath the shelving edge of the inguinal ligament.  The area was copiously irrigated.  Hemostasis was obtained.  The external oblique was closed with running 2-0 Vicryl.  Subcutaneous tissue was closed with interrupted 3-0 Vicryl and the skin was closed with running 4-0 Monocryl subcuticular followed by Dermabond.  All counts were correct.  He tolerated the procedure well without apparent complication was taken recovery in stable condition. PATIENT DISPOSITION:  PACU - hemodynamically stable.   Delay start of Pharmacological VTE agent (>24hrs) due to surgical blood loss or risk of bleeding:  no  Georganna Skeans, MD, MPH, FACS Pager: [phone]  6/28/20233:01 PM

## 2021-12-22 NOTE — Anesthesia Procedure Notes (Signed)
Procedure Name: Intubation Date/Time: 12/22/2021 1:09 PM  Performed by: Inda Coke, CRNAPre-anesthesia Checklist: Patient identified, Emergency Drugs available, Suction available, Timeout performed and Patient being monitored Patient Re-evaluated:Patient Re-evaluated prior to induction Oxygen Delivery Method: Circle system utilized Preoxygenation: Pre-oxygenation with 100% oxygen Induction Type: IV induction Ventilation: Mask ventilation without difficulty Laryngoscope Size: Mac and 4 Grade View: Grade I Tube type: Oral Tube size: 7.5 mm Airway Equipment and Method: Stylet Placement Confirmation: ETT inserted through vocal cords under direct vision, positive ETCO2, CO2 detector and breath sounds checked- equal and bilateral Secured at: 22 cm Tube secured with: Tape Dental Injury: Teeth and Oropharynx as per pre-operative assessment

## 2021-12-22 NOTE — Anesthesia Preprocedure Evaluation (Addendum)
Anesthesia Evaluation  Patient identified by MRN, date of birth, ID band Patient awake    Reviewed: Allergy & Precautions, NPO status , Patient's Chart, lab work & pertinent test results  Airway Mallampati: II  TM Distance: >3 FB Neck ROM: Full    Dental no notable dental hx.    Pulmonary neg pulmonary ROS, Current Smoker and Patient abstained from smoking.,    Pulmonary exam normal breath sounds clear to auscultation       Cardiovascular hypertension, Pt. on medications negative cardio ROS Normal cardiovascular exam Rhythm:Regular Rate:Normal     Neuro/Psych  Headaches, negative neurological ROS  negative psych ROS   GI/Hepatic negative GI ROS, Neg liver ROS,   Endo/Other  negative endocrine ROS  Renal/GU negative Renal ROS  negative genitourinary   Musculoskeletal negative musculoskeletal ROS (+)   Abdominal   Peds negative pediatric ROS (+)  Hematology negative hematology ROS (+) Hb 16   Anesthesia Other Findings Incarcerated R inguinal hernia  Reproductive/Obstetrics negative OB ROS                            Anesthesia Physical Anesthesia Plan  ASA: 2 and emergent  Anesthesia Plan: General   Post-op Pain Management: Tylenol PO (pre-op)*, Toradol IV (intra-op)* and Dilaudid IV   Induction: Intravenous  PONV Risk Score and Plan: 1 and Ondansetron and Treatment may vary due to age or medical condition  Airway Management Planned: Oral ETT  Additional Equipment: None  Intra-op Plan:   Post-operative Plan: Extubation in OR  Informed Consent: I have reviewed the patients History and Physical, chart, labs and discussed the procedure including the risks, benefits and alternatives for the proposed anesthesia with the patient or authorized representative who has indicated his/her understanding and acceptance.     Dental advisory given  Plan Discussed with: CRNA  Anesthesia  Plan Comments:        Anesthesia Quick Evaluation

## 2021-12-22 NOTE — H&P (Signed)
Duane Lambert 08-01-1960  [id number].    Requesting MD: Rancour, MD Chief Complaint/Reason for Consult: right inguinal hernia  HPI:  [name] is a 61 y/o M with a PMH of right inguinal hernia, tobacco use, HTN, and HLD who presented to the ED with a cc right inguinal hernia pain. 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 His cc is pain over right groin, abdominal distention, and 2 episodes of nausea/vomiting. Also reports some lower abd pain with urination. Unsure of last flatus. Says his last BM was yesterday evening. He states he has had the hernia for many years but it has been popping out more lately. He was last seen in the ED 11/01/21 where his hernia was successfully reduced and he was referred to our office for outpatient follow up but he did not follow up. He reports NKDA. Does not use blood thinners. Reports smoking 1/2 ppd cigarettes for many years. He asks that we call his sister-in-law to update her on his plan of care. Surgical history significant for laparoscopic appendectomy.  CT abd/pelvis revealed RIH with SBO at the level of his hernia and surgery is asked to consult.  ROS: Review of Systems  All other systems reviewed and are negative.   No family history on file.  Past Medical History:  Diagnosis Date   Headache    Hypertension     Past Surgical History:  Procedure Laterality Date   APPENDECTOMY     LAPAROSCOPIC APPENDECTOMY  09/09/2011   Procedure: APPENDECTOMY LAPAROSCOPIC;  Surgeon: Doreen Salvage, MD;  Location: [facility];  Service: General;  Laterality: N/A;    Social History:  reports that he has been smoking cigarettes. He has been smoking an average of 1 pack per day. He uses smokeless tobacco. He reports that he does not currently use drugs. He reports that he does not drink alcohol.  Allergies: No Known Allergies  (Not in a hospital  admission)    Physical Exam: Blood pressure (!) 170/108, pulse 78, temperature 98.1 F (36.7 C), temperature source Oral, resp. rate 18, SpO2 97 %. General: Pleasant male laying in bed in NAD, appears stated age. HEENT: head -normocephalic, atraumatic; Eyes: PERRLA, anicteric sclerae,  Neck- Trachea is midline, no thyromegaly or JVD appreciated.  CV- RRR, normal S1/S2, no M/R/G, no lower extremity edema Pulm- breathing is non-labored. CTABL, no wheezes, rhales, rhonchi. Abd- soft, globally tender abdomen without out peritonitis, no organomegaly  GU- normal external exam, there is a right inguinal hernia, it is tender without overlying skin changes, I was unable to reduce the hernia. MSK- UE/LE symmetrical, no cyanosis, clubbing, or edema. Neuro- CN II-XII grossly in tact, no paresthesias. Psych- Alert and Oriented x3 with appropriate affect Skin: warm and dry, no rashes or lesions   Results for orders placed or performed during the hospital encounter of 12/22/21 (from the past 48 hour(s))  Urinalysis, Routine w reflex microscopic Urine, Clean Catch     Status: Abnormal   Collection Time: 12/22/21  3:39 AM  Result Value Ref Range   Color, Urine YELLOW YELLOW   APPearance HAZY (A) CLEAR   Specific Gravity, Urine 1.023 1.005 - 1.030   pH 5.0 5.0 - 8.0   Glucose, UA NEGATIVE NEGATIVE mg/dL   Hgb urine dipstick SMALL (A) NEGATIVE   Bilirubin Urine NEGATIVE NEGATIVE   Ketones, ur NEGATIVE NEGATIVE mg/dL   Protein,  ur NEGATIVE NEGATIVE mg/dL   Nitrite POSITIVE (A) NEGATIVE   Leukocytes,Ua MODERATE (A) NEGATIVE   RBC / HPF 6-10 0 - 5 RBC/hpf   WBC, UA 21-50 0 - 5 WBC/hpf   Bacteria, UA MANY (A) NONE SEEN   Mucus PRESENT     Comment: Performed at [facility] [address] [place], [place] [address]  Lipase, blood     Status: None   Collection Time: 12/22/21  3:41 AM  Result Value Ref Range   Lipase 20 11 - 51 U/L    Comment: Performed at [facility]  [address], [place], [place] [address]  Comprehensive metabolic panel     Status: Abnormal   Collection Time: 12/22/21  3:41 AM  Result Value Ref Range   Sodium 136 135 - 145 mmol/L   Potassium 3.7 3.5 - 5.1 mmol/L   Chloride 102 98 - 111 mmol/L   CO2 22 22 - 32 mmol/L   Glucose, Bld 177 (H) 70 - 99 mg/dL    Comment: Glucose reference range applies only to samples taken after fasting for at least 8 hours.   BUN 12 6 - 20 mg/dL   Creatinine, Ser 0.83 0.61 - 1.24 mg/dL   Calcium 9.8 8.9 - 10.3 mg/dL   Total Protein 8.0 6.5 - 8.1 g/dL   Albumin 4.3 3.5 - 5.0 g/dL   AST 36 15 - 41 U/L   ALT 38 0 - 44 U/L   Alkaline Phosphatase 75 38 - 126 U/L   Total Bilirubin 0.5 0.3 - 1.2 mg/dL   GFR, Estimated >60 >60 mL/min    Comment: (NOTE) Calculated using the CKD-EPI Creatinine Equation (2021)    Anion gap 12 5 - 15    Comment: Performed at [facility] [address], [place] [address]  CBC     Status: None   Collection Time: 12/22/21  3:41 AM  Result Value Ref Range   WBC 8.2 4.0 - 10.5 K/uL   RBC 5.48 4.22 - 5.81 MIL/uL   Hemoglobin 16.1 13.0 - 17.0 g/dL   HCT 47.9 39.0 - 52.0 %   MCV 87.4 80.0 - 100.0 fL   MCH 29.4 26.0 - 34.0 pg   MCHC 33.6 30.0 - 36.0 g/dL   RDW 14.7 11.5 - 15.5 %   Platelets 187 150 - 400 K/uL   nRBC 0.0 0.0 - 0.2 %    Comment: Performed at [facility], [place] [address], [place], [place] [address]   CT ABDOMEN PELVIS W CONTRAST  Result Date: 12/22/2021 CLINICAL DATA:  Abdominal pain, acute, nonlocalized abdominal pain, vomiting, R inguinal hernia EXAM: CT ABDOMEN AND PELVIS WITH CONTRAST TECHNIQUE: Multidetector CT imaging of the abdomen and pelvis was performed using the standard protocol following bolus administration of intravenous contrast. RADIATION DOSE REDUCTION: This exam was performed according to the departmental dose-optimization program which includes automated exposure control, adjustment of the mA and/or kV according to patient size  and/or use of iterative reconstruction technique. CONTRAST:  149m OMNIPAQUE IOHEXOL 350 MG/ML SOLN COMPARISON:  None Available. FINDINGS: Lower chest: No acute abnormality. Hepatobiliary: No focal liver abnormality is seen. No gallstones, gallbladder wall thickening, or biliary dilatation. Pancreas: Unremarkable. No pancreatic ductal dilatation or surrounding inflammatory changes. Spleen: Unremarkable. Adrenals/Urinary Tract: 1.5 cm right adrenal nodule. Kidneys are unremarkable. Bladder is partially distended and slightly extends into the right inguinal hernia. Stomach/Bowel: Stomach is within normal limits. There is dilatation of  fluid-filled distal small bowel loops with transition point within the right inguinal hernia. The loop within the hernia demonstrates wall thickening. Distal ileum beyond this point is normal in caliber. Colon is normal in caliber. Vascular/Lymphatic: Atherosclerosis.  No enlarged nodes. Reproductive: Unremarkable. Other: Partially imaged free fluid in the right inguinal hernia. Musculoskeletal: No acute osseous abnormality. IMPRESSION: Distal small bowel obstruction with transition point within a right inguinal hernia. The loop within the hernia demonstrates wall thickening and there is adjacent fluid raising the possibility of strangulation. A small portion of bladder also extends into the hernia. Indeterminate 1.5 cm right adrenal nodule. Recommend nonemergent evaluation with adrenal protocol CT. Initial results were called by telephone at the time of interpretation on 12/22/2021 at 10:01 am to provider [facility] , who verbally acknowledged these results. Electronically Signed   By: Macy Mis M.D.   On: 12/22/2021 10:01    Assessment/Plan Incarcerated RIH causing small bowel obstruction  Hernia has become more symptomatic over the last few months and is now causing SBO. No signs of bowel ischemia on CT. I am unable to reduce it at the bedside. Patient is hemodynamically  stable, hypertensive, with normal WBC count. I recommend proceeding to the operating room urgently for right inguinal hernia repair. Discussed the possibility of laparotomy, bowel resection, as well as placement of mesh with the patient and he would like to proceed with surgery.   NPO, IV abx on call to OR, plan to admit to [facility] service.  Appreciate RN attempting NG placement, pt refused re-attempt. We will place in OR.   I reviewed last 24 h vitals and pain scores, last 48 h intake and output, last 24 h labs and trends, and last 24 h imaging results.  Jill Alexanders, PA-C [facility] 12/22/2021, 11:27 AM Please see Amion for pager number during day hours 7:00am-4:30pm or 7:00am -11:30am on weekends

## 2021-12-22 NOTE — ED Notes (Signed)
Attempted inserting NG tube in pt, but the pt pulled it out after insertion stated that he can't breath w that thing in his nose. Won't allow another attempt.  EDP made aware.

## 2021-12-22 NOTE — Transfer of Care (Signed)
Immediate Anesthesia Transfer of Care Note  Patient: Duane Lambert  Procedure(s) Performed: OPEN  INGUINAL HERNIA REPAIR POSSIBLE LAPAROTOMY (Right: Groin)  Patient Location: PACU  Anesthesia Type:General  Level of Consciousness: awake and drowsy  Airway & Oxygen Therapy: Patient Spontanous Breathing and Patient connected to nasal cannula oxygen  Post-op Assessment: Report given to RN and Post -op Vital signs reviewed and stable  Post vital signs: Reviewed and stable  Last Vitals:  Vitals Value Taken Time  BP 157/99 12/22/21 1520  Temp 36.5 C 12/22/21 1520  Pulse 74 12/22/21 1522  Resp 18 12/22/21 1522  SpO2 96 % 12/22/21 1522  Vitals shown include unvalidated device data.  Last Pain:  Vitals:   12/22/21 1520  TempSrc:   PainSc: Asleep         Complications: No notable events documented.

## 2021-12-22 NOTE — ED Triage Notes (Signed)
Patient reports mid abdominal pain radiating to right groin onset 6 pm last night after eating supper , no emesis or diarrhea , denies fever or chills .

## 2021-12-23 ENCOUNTER — Other Ambulatory Visit: Payer: Self-pay

## 2021-12-23 ENCOUNTER — Encounter (HOSPITAL_COMMUNITY): Payer: Self-pay | Admitting: General Surgery

## 2021-12-23 LAB — BASIC METABOLIC PANEL
Anion gap: 9 (ref 5–15)
BUN: 9 mg/dL (ref 6–20)
CO2: 24 mmol/L (ref 22–32)
Calcium: 8.7 mg/dL — ABNORMAL LOW (ref 8.9–10.3)
Chloride: 101 mmol/L (ref 98–111)
Creatinine, Ser: 0.8 mg/dL (ref 0.61–1.24)
GFR, Estimated: >60 mL/min (ref >60)
Glucose, Bld: 121 mg/dL — ABNORMAL HIGH (ref 70–99)
Potassium: 4 mmol/L (ref 3.5–5.1)
Sodium: 134 mmol/L — ABNORMAL LOW (ref 135–145)

## 2021-12-23 LAB — HEMOGLOBIN A1C
Hgb A1c MFr Bld: 5.6 % (ref 4.8–5.6)
Mean Plasma Glucose: 114.02 mg/dL

## 2021-12-23 LAB — CBC
HCT: 42.2 % (ref 39.0–52.0)
Hemoglobin: 13.9 g/dL (ref 13.0–17.0)
MCH: 28.9 pg (ref 26.0–34.0)
MCHC: 32.9 g/dL (ref 30.0–36.0)
MCV: 87.7 fL (ref 80.0–100.0)
Platelets: 167 10*3/uL (ref 150–400)
RBC: 4.81 MIL/uL (ref 4.22–5.81)
RDW: 14.9 % (ref 11.5–15.5)
WBC: 12.6 10*3/uL — ABNORMAL HIGH (ref 4.0–10.5)
nRBC: 0 % (ref 0.0–0.2)

## 2021-12-23 MED ORDER — ACETAMINOPHEN 650 MG RE SUPP: 650.0000 mg | Freq: Four times a day (QID) | RECTAL | Status: DC

## 2021-12-23 MED ORDER — PANTOPRAZOLE SODIUM 40 MG PO TBEC: 40.0000 mg | DELAYED_RELEASE_TABLET | Freq: Every day | ORAL | Status: DC

## 2021-12-23 MED ORDER — ACETAMINOPHEN 500 MG PO TABS: 1000.0000 mg | ORAL_TABLET | Freq: Four times a day (QID) | ORAL | Status: DC | PRN

## 2021-12-23 MED ORDER — POLYETHYLENE GLYCOL 3350 17 G PO PACK: 17.0000 g | PACK | Freq: Every day | ORAL | 0 refills | Status: DC | PRN

## 2021-12-23 MED ORDER — METHOCARBAMOL 750 MG PO TABS: 750.0000 mg | ORAL_TABLET | Freq: Four times a day (QID) | ORAL | 0 refills | Status: DC | PRN

## 2021-12-23 MED ORDER — ACETAMINOPHEN 650 MG RE SUPP: 650.0000 mg | Freq: Four times a day (QID) | RECTAL | Status: DC | PRN

## 2021-12-23 MED ORDER — DOCUSATE SODIUM 100 MG PO CAPS
100.0000 mg | ORAL_CAPSULE | Freq: Two times a day (BID) | ORAL | 0 refills | Status: DC
  Filled 2021-12-23: qty 10, 5d supply, fill #0

## 2021-12-23 MED ORDER — METHOCARBAMOL 750 MG PO TABS
750.0000 mg | ORAL_TABLET | Freq: Four times a day (QID) | ORAL | 0 refills | Status: DC | PRN
  Filled 2021-12-23: qty 30, 8d supply, fill #0

## 2021-12-23 MED ORDER — ACETAMINOPHEN 500 MG PO TABS
1000.0000 mg | ORAL_TABLET | Freq: Four times a day (QID) | ORAL | Status: DC
  Administered 2021-12-23: 1000 mg via ORAL
  Filled 2021-12-23: qty 2

## 2021-12-23 MED ORDER — IBUPROFEN 600 MG PO TABS
600.0000 mg | ORAL_TABLET | Freq: Three times a day (TID) | ORAL | 0 refills | Status: DC | PRN
  Filled 2021-12-23: qty 21, 7d supply, fill #0

## 2021-12-23 MED ORDER — DOCUSATE SODIUM 100 MG PO CAPS: 100.0000 mg | ORAL_CAPSULE | Freq: Two times a day (BID) | ORAL | 0 refills | Status: DC

## 2021-12-23 MED ORDER — OXYCODONE HCL 5 MG PO TABS
5.0000 mg | ORAL_TABLET | Freq: Four times a day (QID) | ORAL | 0 refills | Status: DC | PRN
  Filled 2021-12-23: qty 20, 5d supply, fill #0

## 2021-12-23 MED ORDER — OXYCODONE HCL 5 MG PO TABS
5.0000 mg | ORAL_TABLET | Freq: Four times a day (QID) | ORAL | 0 refills | Status: DC | PRN
Start: 2021-12-23 — End: 2021-12-23

## 2021-12-23 MED ORDER — KETOROLAC TROMETHAMINE 15 MG/ML IJ SOLN
15.0000 mg | Freq: Four times a day (QID) | INTRAMUSCULAR | Status: DC
Start: 2021-12-23 — End: 2021-12-23
  Administered 2021-12-23: 15 mg via INTRAVENOUS
  Filled 2021-12-23: qty 1

## 2021-12-23 MED ORDER — ACETAMINOPHEN 500 MG PO TABS: 1000.0000 mg | ORAL_TABLET | Freq: Four times a day (QID) | ORAL | 0 refills | Status: AC | PRN

## 2021-12-23 MED ORDER — IBUPROFEN 600 MG PO TABS: 600.0000 mg | ORAL_TABLET | Freq: Three times a day (TID) | ORAL | 0 refills | Status: DC | PRN

## 2021-12-23 MED ORDER — METHOCARBAMOL 750 MG PO TABS
750.0000 mg | ORAL_TABLET | Freq: Four times a day (QID) | ORAL | Status: DC
  Administered 2021-12-23 (×2): 750 mg via ORAL
  Filled 2021-12-23 (×2): qty 1

## 2021-12-23 NOTE — Discharge Instructions (Signed)
[facility]      [facility] [phone]  OPEN ABDOMINAL SURGERY: POST OP INSTRUCTIONS  Always review your discharge instruction sheet given to you by the facility where your surgery was performed.  IF YOU HAVE DISABILITY OR FAMILY LEAVE FORMS, YOU MUST BRING THEM TO THE OFFICE FOR PROCESSING.  PLEASE DO NOT GIVE THEM TO YOUR DOCTOR.  A prescription for pain medication may be given to you upon discharge.  Take your pain medication as prescribed, if needed.  If narcotic pain medicine is not needed, then you may take acetaminophen (Tylenol) or ibuprofen (Advil) as needed. Take your usually prescribed medications unless otherwise directed. If you need a refill on your pain medication, please contact your pharmacy. They will contact our office to request authorization.  Prescriptions will not be filled after 5pm or on week-ends. You should follow a light diet the first few days after arrival home, such as soup and crackers, pudding, etc.unless your doctor has advised otherwise. A high-fiber, low fat diet can be resumed as tolerated.   Be sure to include lots of fluids daily. Most patients will experience some swelling and bruising on the chest and neck area.  Ice packs will help.  Swelling and bruising can take several days to resolve Most patients will experience some swelling and bruising in the area of the incision. Ice pack will help. Swelling and bruising can take several days to resolve..  It is common to experience some constipation if taking pain medication after surgery.  Increasing fluid intake and taking a stool softener will usually help or prevent this problem from occurring.  A mild laxative (Milk of Magnesia or Miralax) should be taken according to package directions if there are no bowel movements after 48 hours.  You may have steri-strips (small skin tapes) in place directly over the incision.  These strips should be left on the skin for 7-10 days.  If your surgeon used skin  glue on the incision, you may shower in 24 hours.  The glue will flake off over the next 2-3 weeks.  Any sutures or staples will be removed at the office during your follow-up visit. You may find that a light gauze bandage over your incision may keep your staples from being rubbed or pulled. You may shower and replace the bandage daily. ACTIVITIES:  You may resume regular (light) daily activities beginning the next day--such as daily self-care, walking, climbing stairs--gradually increasing activities as tolerated.  You may have sexual intercourse when it is comfortable.  Refrain from any heavy lifting or straining until approved by your doctor. You may drive when you no longer are taking prescription pain medication, you can comfortably wear a seatbelt, and you can safely maneuver your car and apply brakes Return to Work: ___________________________________ You should see your doctor in the office for a follow-up appointment approximately two weeks after your surgery.  Make sure that you call for this appointment within a day or two after you arrive home to insure a convenient appointment time. OTHER INSTRUCTIONS:  _____________________________________________________________ _____________________________________________________________  WHEN TO CALL YOUR DOCTOR: Fever over 101.0 Inability to urinate Nausea and/or vomiting Extreme swelling or bruising Continued bleeding from incision. Increased pain, redness, or drainage from the incision. Difficulty swallowing or breathing Muscle cramping or spasms. Numbness or tingling in hands or feet or around lips.  The clinic staff is available to answer your questions during regular business hours.  Please don't hesitate to call and ask to speak to one of   the nurses if you have concerns.  For further questions, please visit [email or web address]  

## 2021-12-23 NOTE — Discharge Summary (Signed)
[facility] Discharge Summary   Patient ID: Duane Lambert MRN: [id number] DOB/AGE: 61/30/1962 61 y.o.  Admit date: 12/22/2021 Discharge date: 12/23/2021  Admitting Diagnosis: Incarcerated RIH causing small bowel obstruction   Discharge Diagnosis Patient Active Problem List   Diagnosis Date Noted   Incarcerated inguinal hernia 12/22/2021   Mixed hyperlipidemia 09/20/2021   Tobacco dependence 09/20/2021   Elevated PSA 09/20/2021   Lipoma of head 07/22/2021   Essential hypertension 04/22/2020   Back pain 04/22/2020   Postop check 09/22/2011    Consultants None  Imaging: CT ABDOMEN PELVIS W CONTRAST  Result Date: 12/22/2021 CLINICAL DATA:  Abdominal pain, acute, nonlocalized abdominal pain, vomiting, R inguinal hernia EXAM: CT ABDOMEN AND PELVIS WITH CONTRAST TECHNIQUE: Multidetector CT imaging of the abdomen and pelvis was performed using the standard protocol following bolus administration of intravenous contrast. RADIATION DOSE REDUCTION: This exam was performed according to the departmental dose-optimization program which includes automated exposure control, adjustment of the mA and/or kV according to patient size and/or use of iterative reconstruction technique. CONTRAST:  160m OMNIPAQUE IOHEXOL 350 MG/ML SOLN COMPARISON:  None Available. FINDINGS: Lower chest: No acute abnormality. Hepatobiliary: No focal liver abnormality is seen. No gallstones, gallbladder wall thickening, or biliary dilatation. Pancreas: Unremarkable. No pancreatic ductal dilatation or surrounding inflammatory changes. Spleen: Unremarkable. Adrenals/Urinary Tract: 1.5 cm right adrenal nodule. Kidneys are unremarkable. Bladder is partially distended and slightly extends into the right inguinal hernia. Stomach/Bowel: Stomach is within normal limits. There is dilatation of fluid-filled distal small bowel loops with transition point within the right inguinal hernia. The loop within the hernia  demonstrates wall thickening. Distal ileum beyond this point is normal in caliber. Colon is normal in caliber. Vascular/Lymphatic: Atherosclerosis.  No enlarged nodes. Reproductive: Unremarkable. Other: Partially imaged free fluid in the right inguinal hernia. Musculoskeletal: No acute osseous abnormality. IMPRESSION: Distal small bowel obstruction with transition point within a right inguinal hernia. The loop within the hernia demonstrates wall thickening and there is adjacent fluid raising the possibility of strangulation. A small portion of bladder also extends into the hernia. Indeterminate 1.5 cm right adrenal nodule. Recommend nonemergent evaluation with adrenal protocol CT. Initial results were called by telephone at the time of interpretation on 12/22/2021 at 10:01 am to provider [facility], who verbally acknowledged these results. Electronically Signed   By: PMacy MisM.D.   On: 12/22/2021 10:01    Procedures Dr. TGrandville Silos(12/22/2021) - [facility] Hospital[facility]:  Duane MORLOCKis a 61y.o. male [facility] recurrent right inguinal her[facility] pain. He was last seen in the ED 11/01/21 where his hernia was successfully reduced and he was referred to our office for outpatient follow up but he did not follow up. Unable to reduce hernia at bedside so he was taken to the OR 6/28 for procedure listed above. Tolerated procedure well and was transferred to the floor.  Diet was advanced as tolerated.  On POD1, the patient was voiding well, tolerating diet, having bowel function, ambulating well, pain well controlled, vital signs stable, incisions c/d/i and felt stable for discharge home.  Patient will follow up as below and knows to call with questions or concerns.    I have personally reviewed the patients medication history on the [place] controlled substance database.    Allergies as of 12/23/2021   No Known Allergies      Medication List      TAKE these  medications    acetaminophen 500 MG tablet Commonly known as: TYLENOL Take 2 tablets (1,000 mg total) by mouth every 6 (six) hours as needed for mild pain.   atorvastatin 10 MG tablet Commonly known as: LIPITOR Take 1 tablet (10 mg total) by mouth daily.   docusate sodium 100 MG capsule Commonly known as: COLACE Take 1 capsule (100 mg total) by mouth 2 (two) times daily.   ibuprofen 600 MG tablet Commonly known as: ADVIL Take 1 tablet (600 mg total) by mouth every 8 (eight) hours as needed for mild pain or moderate pain.   lisinopril 20 MG tablet Commonly known as: ZESTRIL Take 1 tablet (20 mg total) by mouth daily.   methocarbamol 750 MG tablet Commonly known as: ROBAXIN Take 1 tablet (750 mg total) by mouth every 6 (six) hours as needed for muscle spasms (pain not relieved by tylenol or advil).   oxyCODONE 5 MG immediate release tablet Commonly known as: Oxy IR/ROXICODONE Take 1 tablet (5 mg total) by mouth every 6 (six) hours as needed for moderate pain or severe pain (pain not relieved by tylenol or ibuprofen).   polyethylene glycol 17 g packet Commonly known as: MIRALAX / GLYCOLAX Take 17 g by mouth daily as needed for mild constipation.          Follow-up Information     Georganna Skeans, MD. Go on 01/12/2022.   Specialty: General Surgery Why: at 9:10 AM for or post-operative follow up with your surgeon. please arrive by 8:40 AM to get checked in a fill out any necessary paperwork. Contact information: [address] [place] [address] [phone]                  Signed: Wellington Hampshire, [facility] 12/23/2021, 2:43 PM Please see Amion for pager number during day hours 7:00am-4:30pm

## 2021-12-23 NOTE — Progress Notes (Signed)
[facility] Progress Note  1 Day Post-Op  Subjective: CC:  Very sore over incision, worse with movement. Tolerating CLD. Reports flatus and a BM since surgery. States he has been OOB but is sore. Denies nausea or vomiting.  Objective: Vital signs in last 24 hours: Temp:  [97.7 F (36.5 C)-98.6 F (37 C)] 98.6 F (37 C) (06/29 0855) Pulse Rate:  [69-89] 78 (06/29 0855) Resp:  [14-20] 17 (06/29 0855) BP: (126-175)/(86-116) 139/88 (06/29 0855) SpO2:  [91 %-100 %] 99 % (06/29 0855) Weight:  [74.4 kg] 74.4 kg (06/28 1216) Last BM Date : 12/20/21  Intake/Output from previous day: 06/28 0701 - 06/29 0700 In: 1540 [P.O.:240; I.V.:1200; IV Piggyback:100] Out: 225 [Urine:200; Blood:25] Intake/Output this shift: No intake/output data recorded.  PE: Gen:  Alert, NAD, pleasant Pulm:  Normal effort ORA. Abd: Soft, appropriately tender over R groin incision - incision clean and dry without drainage. Abd soft nondistended. Skin: warm and dry, no rashes  Psych: A&Ox3   Lab Results:  Recent Labs    12/22/21 0341 12/23/21 0133  WBC 8.2 12.6*  HGB 16.1 13.9  HCT 47.9 42.2  PLT 187 167   BMET Recent Labs    12/22/21 0341 12/23/21 0133  NA 136 134*  K 3.7 4.0  CL 102 101  CO2 22 24  GLUCOSE 177* 121*  BUN 12 9  CREATININE 0.83 0.80  CALCIUM 9.8 8.7*   PT/INR No results for input(s): "LABPROT", "INR" in the last 72 hours. CMP     Component Value Date/Time   NA 134 (L) 12/23/2021 0133   NA 140 07/27/2021 1247   K 4.0 12/23/2021 0133   CL 101 12/23/2021 0133   CO2 24 12/23/2021 0133   GLUCOSE 121 (H) 12/23/2021 0133   BUN 9 12/23/2021 0133   BUN 12 07/27/2021 1247   CREATININE 0.80 12/23/2021 0133   CALCIUM 8.7 (L) 12/23/2021 0133   PROT 8.0 12/22/2021 0341   PROT 7.4 07/27/2021 1247   ALBUMIN 4.3 12/22/2021 0341   ALBUMIN 4.3 07/27/2021 1247   AST 36 12/22/2021 0341   ALT 38 12/22/2021 0341   ALKPHOS 75 12/22/2021 0341   BILITOT 0.5 12/22/2021  0341   BILITOT 0.3 07/27/2021 1247   GFRNONAA >60 12/23/2021 0133   GFRAA >60 07/25/2017 1539   Lipase     Component Value Date/Time   LIPASE 20 12/22/2021 0341       Studies/Results: CT ABDOMEN PELVIS W CONTRAST  Result Date: 12/22/2021 CLINICAL DATA:  Abdominal pain, acute, nonlocalized abdominal pain, vomiting, R inguinal hernia EXAM: CT ABDOMEN AND PELVIS WITH CONTRAST TECHNIQUE: Multidetector CT imaging of the abdomen and pelvis was performed using the standard protocol following bolus administration of intravenous contrast. RADIATION DOSE REDUCTION: This exam was performed according to the departmental dose-optimization program which includes automated exposure control, adjustment of the mA and/or kV according to patient size and/or use of iterative reconstruction technique. CONTRAST:  141m OMNIPAQUE IOHEXOL 350 MG/ML SOLN COMPARISON:  None Available. FINDINGS: Lower chest: No acute abnormality. Hepatobiliary: No focal liver abnormality is seen. No gallstones, gallbladder wall thickening, or biliary dilatation. Pancreas: Unremarkable. No pancreatic ductal dilatation or surrounding inflammatory changes. Spleen: Unremarkable. Adrenals/Urinary Tract: 1.5 cm right adrenal nodule. Kidneys are unremarkable. Bladder is partially distended and slightly extends into the right inguinal hernia. Stomach/Bowel: Stomach is within normal limits. There is dilatation of fluid-filled distal small bowel loops with transition point within the right inguinal hernia. The loop within the hernia demonstrates wall  thickening. Distal ileum beyond this point is normal in caliber. Colon is normal in caliber. Vascular/Lymphatic: Atherosclerosis.  No enlarged nodes. Reproductive: Unremarkable. Other: Partially imaged free fluid in the right inguinal hernia. Musculoskeletal: No acute osseous abnormality. IMPRESSION: Distal small bowel obstruction with transition point within a right inguinal hernia. The loop within the  hernia demonstrates wall thickening and there is adjacent fluid raising the possibility of strangulation. A small portion of bladder also extends into the hernia. Indeterminate 1.5 cm right adrenal nodule. Recommend nonemergent evaluation with adrenal protocol CT. Initial results were called by telephone at the time of interpretation on 12/22/2021 at 10:01 am to provider [facility] , who verbally acknowledged these results. Electronically Signed   By: Macy Mis M.D.   On: 12/22/2021 10:01    Anti-infectives: Anti-infectives (From admission, onward)    Start     Dose/Rate Route Frequency Ordered Stop   12/22/21 1200  ceFAZolin (ANCEF) IVPB 2g/100 mL premix        2 g 200 mL/hr over 30 Minutes Intravenous On call to O.R. 12/22/21 1155 12/22/21 1342        Assessment/Plan SBO due to incarcerated RIH POD#1 S/P OPEN INGUINAL HERNIA REPAIR WITH MESH 12/22/21 Dr. Grandville Silos - afebrile, H&H stable, having bowel function - increase PO pain meds - advance to soft diet.  - possible discharge home this afternoon.    LOS: 0 days   I reviewed nursing notes, last 24 h vitals and pain scores, last 48 h intake and output, last 24 h labs and trends, and last 24 h imaging results.    Obie Dredge, PA-C [facility] Please see Amion for pager number during day hours 7:00am-4:30pm

## 2021-12-23 NOTE — Progress Notes (Signed)
Mobility Specialist Progress Note:   12/23/21 1140  Mobility  Activity Ambulated with assistance in hallway  Level of Assistance Contact guard assist, steadying assist  Assistive Device Front wheel walker;None  Distance Ambulated (ft) 550 ft  Activity Response Tolerated well  $Mobility charge 1 Mobility   Pt eager for mobility session. Started using RW with gait, progressed to no AD ambulating at a supervision level. Pt c/o abdominal pain throughout session. Back in bed with all needs met.   Nelta Numbers Acute Rehab Secure Chat or Office Phone: [phone]

## 2021-12-24 ENCOUNTER — Telehealth: Payer: Self-pay | Admitting: *Deleted

## 2021-12-24 ENCOUNTER — Ambulatory Visit: Payer: Commercial Managed Care - HMO | Admitting: Internal Medicine

## 2021-12-24 LAB — URINE CULTURE: Culture: >=100000 — AB

## 2021-12-24 NOTE — Telephone Encounter (Signed)
Transition Care Management Follow-up Telephone Call Date of discharge and from where: 12/23/21 FROM [facility] How have you been since you were released from the hospital? [place] Any questions or concerns? No  Items Reviewed: Did the pt receive and understand the discharge instructions provided? Yes  Medications obtained and verified? Yes  Other? No  Any new allergies since your discharge? No  Dietary orders reviewed? Yes Do you have support at home? Yes   Home Care and Equipment/Supplies: Were home health services ordered? no If so, what is the name of the agency? NA  Has the agency set up a time to come to the patient's home? not applicable Were any new equipment or medical supplies ordered?  No What is the name of the medical supply agency? NA Were you able to get the supplies/equipment? not applicable Do you have any questions related to the use of the equipment or supplies? No  Functional Questionnaire: (I = Independent and D = Dependent) ADLs: I  Bathing/Dressing- I  Meal Prep- I  Eating- I  Maintaining continence- I  Transferring/Ambulation- I  Managing Meds- I  Follow up appointments reviewed:  PCP Hospital f/u appt confirmed? No  ,NEEDS TO RESCHEDULE PCP APPOINTMENT CANCELLED [place] f/u appt confirmed? Yes  Scheduled to see DR THOMPSON on 7/19 @ 0910. Are transportation arrangements needed? No  If their condition worsens, is the pt aware to call PCP or go to the Emergency Dept.? Yes Was the patient provided with contact information for the PCP's office or ED? Yes Was to pt encouraged to call back with questions or concerns? Yes    Hubert Azure RN, MSN RN Care Management Coordinator [phone] [email or web address]

## 2021-12-25 ENCOUNTER — Telehealth: Payer: Self-pay | Admitting: Internal Medicine

## 2021-12-25 MED ORDER — CIPROFLOXACIN HCL 500 MG PO TABS: 500.0000 mg | ORAL_TABLET | Freq: Two times a day (BID) | ORAL | 0 refills | Status: DC

## 2021-12-25 NOTE — Telephone Encounter (Signed)
PC placed to pt this evening. Pt advised that I received lab result for UA and Ucx from his recent hospitalization.  UA looks like UTI and Ucx positive for E.Coli.  Denies and dysuria or fever.  I told him I will still send rxn for abx to his pharmacy.  Pt request rxn be sent to [facility] on [address].

## 2021-12-27 IMAGING — CR DG CHEST 2V
2 series · 2 of 2 positions shown · non-contrast
Comparison: None.

CLINICAL DATA: Preop for spinal fusion

EXAM:
CHEST - 2 VIEW

[w chest pa]
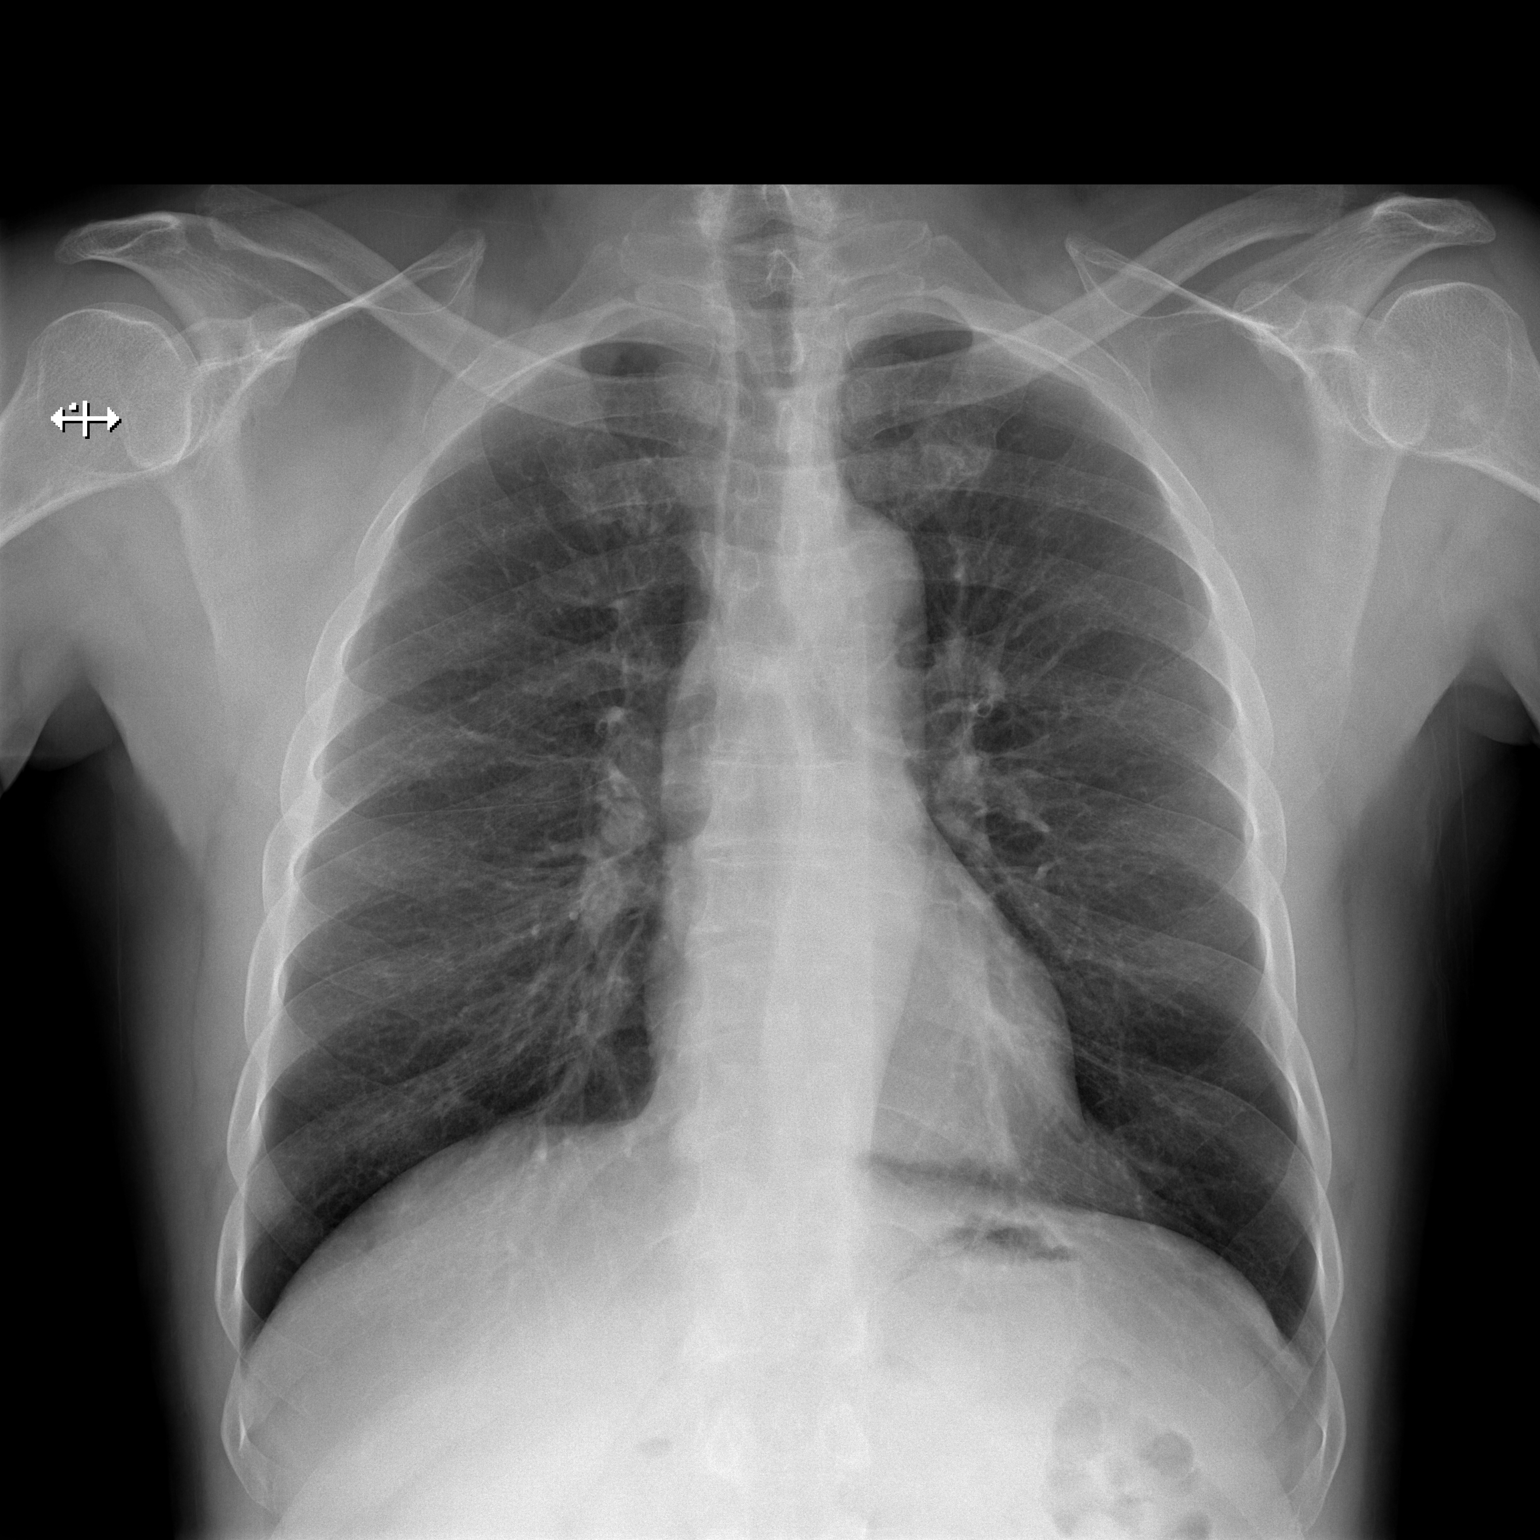

[w chest lat]
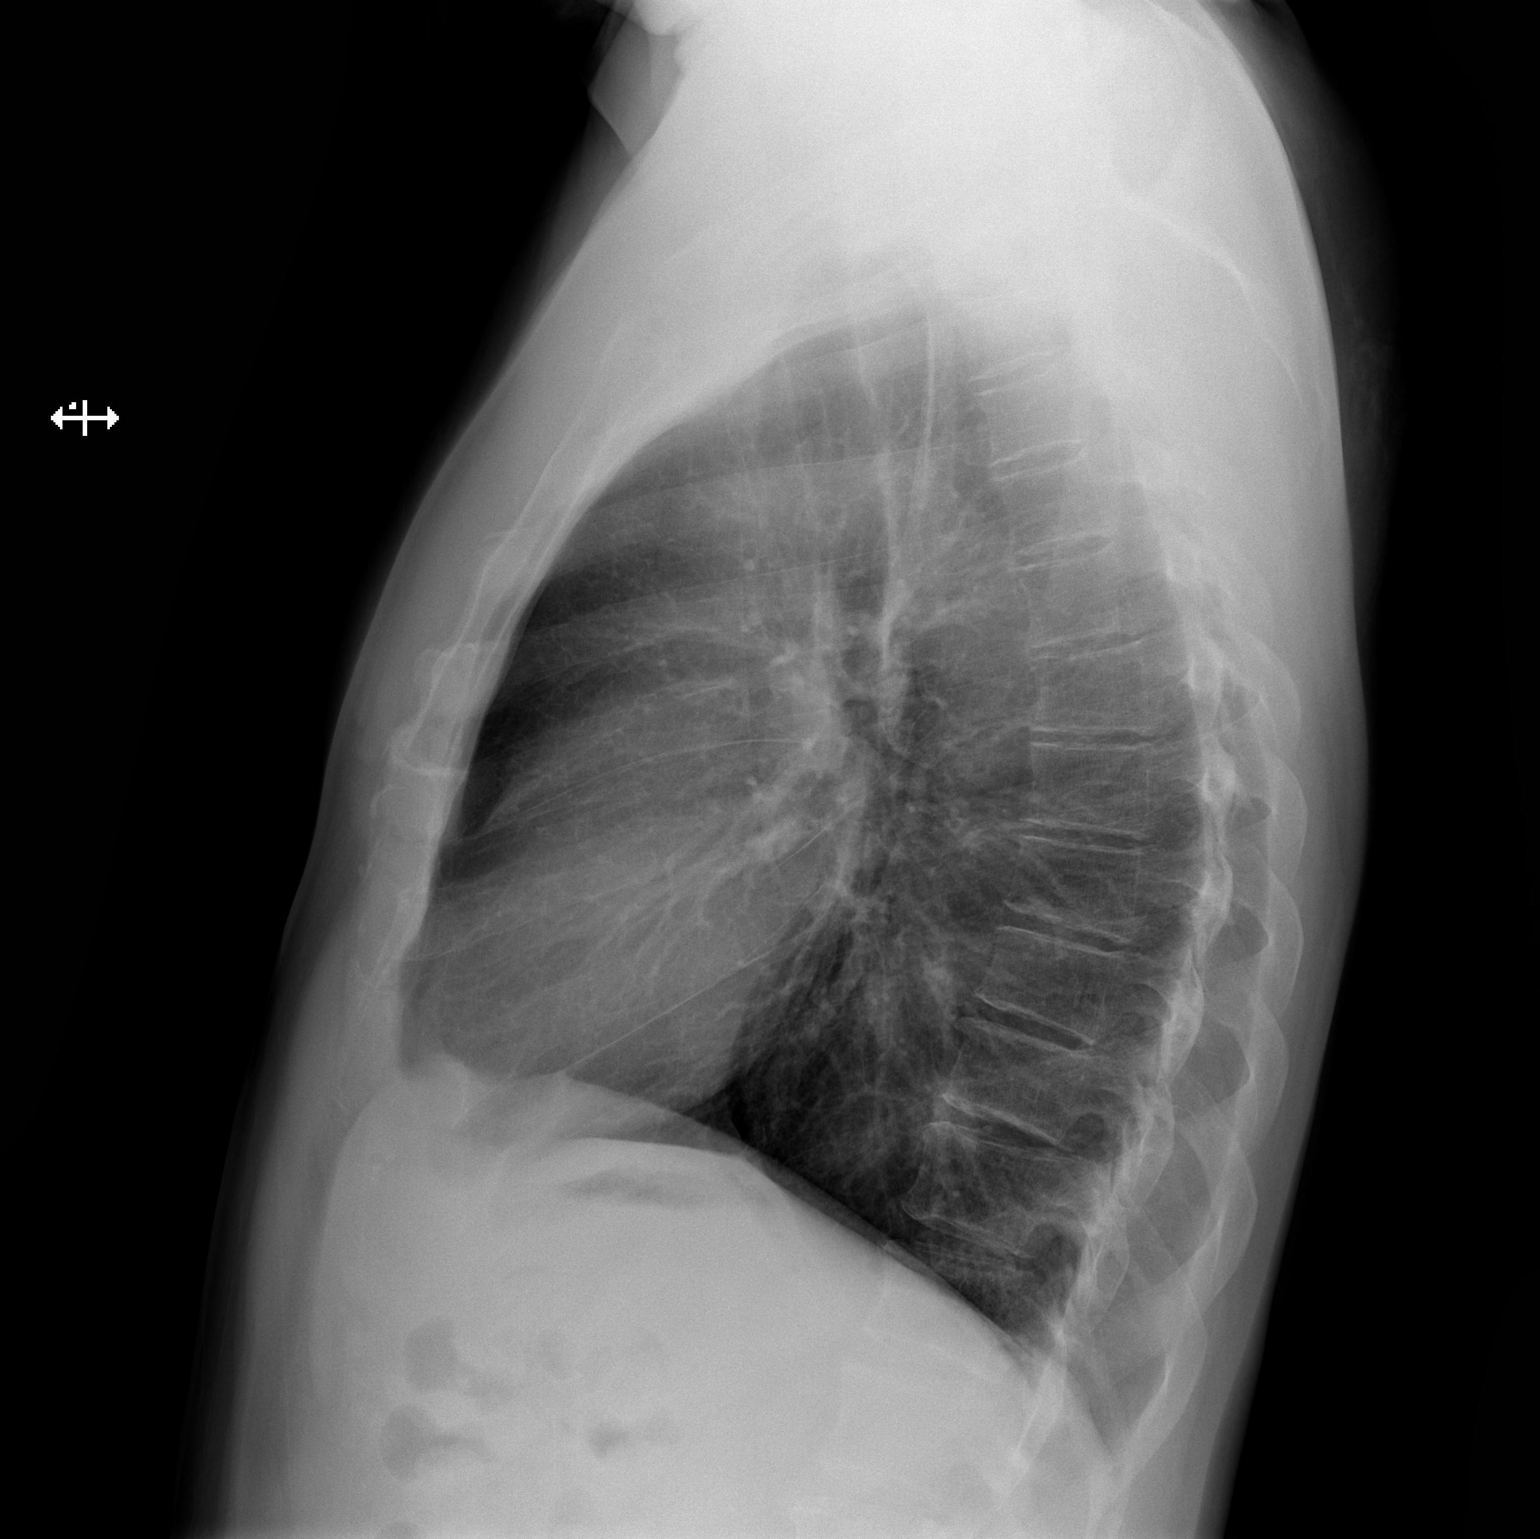

[2 of 2 positions shown; findings below may reference images not displayed]

FINDINGS: The heart size and mediastinal contours are within normal limits.
Both lungs are clear. The visualized skeletal structures are
unremarkable.
IMPRESSION: No active cardiopulmonary disease.

## 2021-12-30 ENCOUNTER — Other Ambulatory Visit: Payer: Self-pay

## 2022-01-04 ENCOUNTER — Other Ambulatory Visit: Payer: Self-pay

## 2022-01-31 ENCOUNTER — Other Ambulatory Visit: Payer: Self-pay | Admitting: Family Medicine

## 2022-01-31 DIAGNOSIS — I10 Essential (primary) hypertension: Secondary | ICD-10-CM

## 2022-02-01 ENCOUNTER — Other Ambulatory Visit: Payer: Self-pay

## 2022-02-01 MED ORDER — LISINOPRIL 20 MG PO TABS
20.0000 mg | ORAL_TABLET | Freq: Every day | ORAL | 2 refills | Status: DC
  Filled 2022-02-01: qty 30, 30d supply, fill #0
  Filled 2022-03-08 – 2022-03-18 (×2): qty 30, 30d supply, fill #1
  Filled 2022-04-15 – 2022-04-25 (×3): qty 30, 30d supply, fill #2

## 2022-02-01 NOTE — Telephone Encounter (Signed)
Requested Prescriptions  Pending Prescriptions Disp Refills  . lisinopril (ZESTRIL) 20 MG tablet 30 tablet 2    Sig: Take 1 tablet (20 mg total) by mouth daily.     Cardiovascular:  ACE Inhibitors Failed - 01/31/2022  6:38 PM      Failed - Last BP in normal range    BP Readings from Last 1 Encounters:  12/23/21 (!) 151/98         Passed - Cr in normal range and within 180 days    Creatinine, Ser  Date Value Ref Range Status  12/23/2021 0.80 0.61 - 1.24 mg/dL Final         Passed - K in normal range and within 180 days    Potassium  Date Value Ref Range Status  12/23/2021 4.0 3.5 - 5.1 mmol/L Final         Passed - Patient is not pregnant      Passed - Valid encounter within last 6 months    Recent Outpatient Visits          2 months ago No-show for appointment   [place], MD   2 months ago Essential hypertension   [place], RPH-CPP   4 months ago Establishing care with new doctor, encounter for   [place], MD      Future Appointments            In 2 weeks Ladell Pier, MD [place]

## 2022-02-04 ENCOUNTER — Other Ambulatory Visit: Payer: Self-pay

## 2022-02-17 ENCOUNTER — Ambulatory Visit: Payer: Commercial Managed Care - HMO | Admitting: Internal Medicine

## 2022-03-08 ENCOUNTER — Other Ambulatory Visit: Payer: Self-pay | Admitting: Internal Medicine

## 2022-03-08 DIAGNOSIS — E782 Mixed hyperlipidemia: Secondary | ICD-10-CM

## 2022-03-08 MED ORDER — ATORVASTATIN CALCIUM 10 MG PO TABS
10.0000 mg | ORAL_TABLET | Freq: Every day | ORAL | 1 refills | Status: DC
  Filled 2022-03-08 – 2022-03-18 (×2): qty 30, 30d supply, fill #0
  Filled 2022-04-15 – 2022-04-25 (×2): qty 30, 30d supply, fill #1

## 2022-03-09 ENCOUNTER — Other Ambulatory Visit: Payer: Self-pay

## 2022-03-11 ENCOUNTER — Other Ambulatory Visit: Payer: Self-pay

## 2022-03-18 ENCOUNTER — Other Ambulatory Visit: Payer: Self-pay

## 2022-03-22 ENCOUNTER — Emergency Department (HOSPITAL_COMMUNITY)
Admission: EM | Admit: 2022-03-22 | Discharge: 2022-03-22 | Disposition: A | Discharge status: Home / Self Care | Payer: Worker's Compensation | Attending: Emergency Medicine | Admitting: Emergency Medicine

## 2022-03-22 ENCOUNTER — Encounter (HOSPITAL_COMMUNITY): Payer: Self-pay

## 2022-03-22 ENCOUNTER — Emergency Department (HOSPITAL_COMMUNITY): Payer: Worker's Compensation

## 2022-03-22 DIAGNOSIS — S62633B Displaced fracture of distal phalanx of left middle finger, initial encounter for open fracture: Secondary | ICD-10-CM | POA: Insufficient documentation

## 2022-03-22 DIAGNOSIS — Z23 Encounter for immunization: Secondary | ICD-10-CM | POA: Diagnosis not present

## 2022-03-22 DIAGNOSIS — W230XXA Caught, crushed, jammed, or pinched between moving objects, initial encounter: Secondary | ICD-10-CM | POA: Diagnosis not present

## 2022-03-22 DIAGNOSIS — S62639B Displaced fracture of distal phalanx of unspecified finger, initial encounter for open fracture: Secondary | ICD-10-CM

## 2022-03-22 DIAGNOSIS — S6992XA Unspecified injury of left wrist, hand and finger(s), initial encounter: Secondary | ICD-10-CM | POA: Diagnosis present

## 2022-03-22 MED ORDER — AMOXICILLIN-POT CLAVULANATE 875-125 MG PO TABS: 1.0000 | ORAL_TABLET | Freq: Two times a day (BID) | ORAL | 0 refills | Status: DC

## 2022-03-22 MED ORDER — LIDOCAINE HCL (PF) 1 % IJ SOLN
30.0000 mL | Freq: Once | INTRAMUSCULAR | Status: DC
  Filled 2022-03-22: qty 30

## 2022-03-22 MED ORDER — TETANUS-DIPHTH-ACELL PERTUSSIS 5-2.5-18.5 LF-MCG/0.5 IM SUSY
PREFILLED_SYRINGE | INTRAMUSCULAR | Status: AC
  Administered 2022-03-22: 0.5 mL via INTRAMUSCULAR
  Filled 2022-03-22: qty 0.5

## 2022-03-22 MED ORDER — HYDROCODONE-ACETAMINOPHEN 5-325 MG PO TABS
1.0000 | ORAL_TABLET | Freq: Once | ORAL | Status: AC
  Administered 2022-03-22: 1 via ORAL
  Filled 2022-03-22: qty 1

## 2022-03-22 MED ORDER — POVIDONE-IODINE 10 % EX SOLN
Freq: Once | CUTANEOUS | Status: DC
  Filled 2022-03-22: qty 118

## 2022-03-22 MED ORDER — TETANUS-DIPHTH-ACELL PERTUSSIS 5-2.5-18.5 LF-MCG/0.5 IM SUSY
0.5000 mL | PREFILLED_SYRINGE | Freq: Once | INTRAMUSCULAR | Status: AC
  Filled 2022-03-22: qty 0.5

## 2022-03-22 MED ORDER — AMOXICILLIN-POT CLAVULANATE 875-125 MG PO TABS
1.0000 | ORAL_TABLET | Freq: Once | ORAL | Status: AC
  Administered 2022-03-22: 1 via ORAL
  Filled 2022-03-22: qty 1

## 2022-03-22 NOTE — ED Triage Notes (Signed)
Pt arrived via POV, got finger stuck in lift gate this morning. Bleeding controlled.

## 2022-03-22 NOTE — Discharge Instructions (Signed)
Wound clean and dry.  Take the antibiotics as prescribed.  Dr. Grandville Silos will call you for an appointment later this week.  Return to the ED with worsening symptoms.

## 2022-03-22 NOTE — ED Provider Triage Note (Addendum)
Emergency Medicine Provider Triage Evaluation Note  Duane Lambert , a 61 y.o. male  was evaluated in triage.  Pt complains of partial amputation today at work.  Reports he happened to sliced the tip of his finger.  Last tetanus immunization is unknown.  Last meal intake unknown.  Review of Systems  Positive: wound Negative: fever  Physical Exam  BP (!) 146/110 (BP Location: Right Arm)   Pulse 100   Temp 98.8 F (37.1 C) (Oral)   Resp 20   Ht '5\' 7"'$  (1.702 m)   Wt 74.8 kg   SpO2 96%   BMI 25.84 kg/m  Gen:   Awake, no distress   Resp:  Normal effort  MSK:   Moves extremities without difficulty  Other:  Missing DIP of the LEFT middle finger  Medical Decision Making  Medically screening exam initiated at 3:20 PM.  Appropriate orders placed.  Duane Lambert was informed that the remainder of the evaluation will be completed by another provider, this initial triage assessment does not replace that evaluation, and the importance of remaining in the ED until their evaluation is complete.  Please see photos, hand surgery contacted.       3:34 PM Spoke to Silvestre Gunner who recommended xeroform and padded splint, patient can follow up outpatient with him in the upcoming week.    Janeece Fitting, PA-C 03/22/22 [place], Shanel Prazak, PA-C 03/22/22 1535

## 2022-03-22 NOTE — ED Provider Notes (Signed)
[facility] DEPT Provider Note   CSN: [id number] Arrival date & time: 03/22/22  1441     History  Chief Complaint  Patient presents with   Finger Injury    Duane Lambert is a 61 y.o. male.  Patient with partial amputation of fingertip of left middle finger.  He got it caught in a lift gate of a truck around 2 PM.  Bleeding is controlled.  Complains of pain with numbness and tingling in the fingertip.  Distal fingertip and nail plate is missing.  No blood thinner use.  No weakness, numbness or tingling.  No chest pain or shortness of breath.  Tetanus needs updating.  The history is provided by the patient.       Home Medications Prior to Admission medications   Medication Sig Start Date End Date Taking? Authorizing Provider  acetaminophen (TYLENOL) 500 MG tablet Take 2 tablets (1,000 mg total) by mouth every 6 (six) hours as needed for mild pain. 12/23/21   Jill Alexanders, PA-C  atorvastatin (LIPITOR) 10 MG tablet Take 1 tablet (10 mg total) by mouth daily. 03/08/22   Ladell Pier, MD  ciprofloxacin (CIPRO) 500 MG tablet Take 1 tablet (500 mg total) by mouth 2 (two) times daily. 12/25/21   Ladell Pier, MD  docusate sodium (COLACE) 100 MG capsule Take 1 capsule (100 mg total) by mouth 2 (two) times daily. 12/23/21   Meuth, Brooke A, PA-C  ibuprofen (ADVIL) 600 MG tablet Take 1 tablet (600 mg total) by mouth every 8 (eight) hours as needed for mild pain or moderate pain. 12/23/21   Meuth, Brooke A, PA-C  lisinopril (ZESTRIL) 20 MG tablet Take 1 tablet (20 mg total) by mouth daily. 02/01/22   Ladell Pier, MD  methocarbamol (ROBAXIN) 750 MG tablet Take 1 tablet (750 mg total) by mouth every 6 (six) hours as needed for muscle spasms (pain not relieved by tylenol or advil). 12/23/21   Meuth, Brooke A, PA-C  oxyCODONE (OXY IR/ROXICODONE) 5 MG immediate release tablet Take 1 tablet (5 mg total) by mouth every 6 (six) hours as needed for  moderate pain or severe pain (pain not relieved by tylenol or ibuprofen). 12/23/21   Meuth, Brooke A, PA-C  polyethylene glycol (MIRALAX / GLYCOLAX) 17 g packet Take 17 g by mouth daily as needed for mild constipation. 12/23/21   Meuth, Blaine Hamper, PA-C      Allergies    Patient has no known allergies.    Review of Systems   Review of Systems  Skin:  Positive for wound.    Physical Exam Updated Vital Signs BP (!) 146/110 (BP Location: Right Arm)   Pulse 100   Temp 98.8 F (37.1 C) (Oral)   Resp 20   Ht '5\' 7"'$  (1.702 m)   Wt 74.8 kg   SpO2 96%   BMI 25.84 kg/m  Physical Exam Vitals and nursing note reviewed.  Constitutional:      General: He is not in acute distress.    Appearance: He is well-developed.  HENT:     Head: Normocephalic and atraumatic.     Mouth/Throat:     Pharynx: No oropharyngeal exudate.  Eyes:     Conjunctiva/sclera: Conjunctivae normal.     Pupils: Pupils are equal, round, and reactive to light.  Neck:     Comments: No meningismus. Cardiovascular:     Rate and Rhythm: Normal rate and regular rhythm.     Heart sounds: Normal  heart sounds. No murmur heard. Pulmonary:     Effort: Pulmonary effort is normal. No respiratory distress.     Breath sounds: Normal breath sounds.  Abdominal:     Palpations: Abdomen is soft.     Tenderness: There is no abdominal tenderness. There is no guarding or rebound.  Musculoskeletal:        General: No tenderness. Normal range of motion.     Cervical back: Normal range of motion and neck supple.     Comments: Amputation of left middle fingertip as depicted.  Bleeding controlled.  Nail plate is avulsed and missing.  Flexion extension intact at PIP, DIP and MCP joints  Skin:    General: Skin is warm.  Neurological:     Mental Status: He is alert and oriented to person, place, and time.     Cranial Nerves: No cranial nerve deficit.     Motor: No abnormal muscle tone.     Coordination: Coordination normal.     Comments:   5/5 strength throughout. CN 2-12 intact.Equal grip strength.   Psychiatric:        Behavior: Behavior normal.        ED Results / Procedures / Treatments   Labs (all labs ordered are listed, but only abnormal results are displayed) Labs Reviewed - No data to display  EKG None  Radiology DG Finger Middle Left  Result Date: 03/22/2022 CLINICAL DATA:  Injury, amputation. EXAM: LEFT MIDDLE FINGER 2+V COMPARISON:  Left hand x-ray 11/24/2017 FINDINGS: There is amputation of the soft tissues and distal 2 mm of the osseous tuft of the third digit. No radiopaque foreign body. No dislocation. IMPRESSION: 1. Amputation of soft tissues and distal 2 mm of the osseous tuft of the third finger. Electronically Signed   By: Ronney Asters M.D.   On: 03/22/2022 16:17    Procedures .Nerve Block  Date/Time: 03/22/2022 4:19 PM  Performed by: Ezequiel Essex, MD Authorized by: Ezequiel Essex, MD   Consent:    Consent obtained:  Verbal   Consent given by:  Patient   Risks, benefits, and alternatives were discussed: yes     Risks discussed:  Allergic reaction, bleeding, intravenous injection, infection, nerve damage, pain, unsuccessful block and swelling   Alternatives discussed:  No treatment Universal protocol:    Procedure explained and questions answered to patient or proxy's satisfaction: yes     Relevant documents present and verified: yes     Test results available: yes     Imaging studies available: yes     Immediately prior to procedure, a time out was called: yes     Patient identity confirmed:  Verbally with patient Indications:    Indications:  Procedural anesthesia Location:    Body area:  Upper extremity   Upper extremity nerve blocked: digital block of L middle phalanx.   Laterality:  Left Pre-procedure details:    Skin preparation:  Chlorhexidine   Preparation: Patient was prepped and draped in usual sterile fashion   Skin anesthesia:    Skin anesthesia method:  Local  infiltration   Local anesthetic:  Lidocaine 1% w/o epi Procedure details:    Block needle gauge:  25 G   Additive injected:  None   Injection procedure:  Anatomic landmarks identified, incremental injection, introduced needle, negative aspiration for blood and anatomic landmarks palpated Post-procedure details:    Outcome:  Pain improved   Procedure completion:  Tolerated     Medications Ordered in ED Medications  lidocaine (PF) (  XYLOCAINE) 1 % injection 30 mL (has no administration in time range)  Tdap (BOOSTRIX) injection 0.5 mL (has no administration in time range)  HYDROcodone-acetaminophen (NORCO/VICODIN) 5-325 MG per tablet 1 tablet (has no administration in time range)  povidone-Iodine (BETADINE) 5 % topical solution (has no administration in time range)    ED Course/ Medical Decision Making/ A&P                           Medical Decision Making Amount and/or Complexity of Data Reviewed Radiology: ordered.  Risk OTC drugs. Prescription drug management.  Fingertip amputation.  Neurovascularly intact.  X-ray obtained to assess for fracture. Discussed with patient that finger will always have a deformity after this injury.   Update tetanus. Will perform digital block and clean wound.  Discussed with hand surgery  Digital block performed, wound copiously irrigated. X-ray concerning for distal fingertip amputation with tuft fracture.  Reviewed and interpreted by me.  Discussed Dr. Grandville Silos of hand surgery.  He agrees with Xeroform dressing, padded splint, follow-up in the office tomorrow.  States his office will call patient.  Keep clean and dry. Tetanus updated.  Prophylactic antibiotics to be given. Wound splinted.  Follow-up with Dr. Grandville Silos later this week.  Return precautions discussed         Final Clinical Impression(s) / ED Diagnoses Final diagnoses:  Open fracture of tuft of distal phalanx of finger    Rx / DC Orders ED Discharge Orders      None         Marcella Dunnaway, Annie Main, MD 03/22/22 1643

## 2022-04-15 ENCOUNTER — Other Ambulatory Visit: Payer: Self-pay

## 2022-04-21 ENCOUNTER — Other Ambulatory Visit: Payer: Self-pay

## 2022-04-25 ENCOUNTER — Other Ambulatory Visit: Payer: Self-pay

## 2022-04-29 ENCOUNTER — Other Ambulatory Visit: Payer: Self-pay

## 2022-04-29 ENCOUNTER — Ambulatory Visit: Payer: Self-pay | Attending: Internal Medicine | Admitting: Internal Medicine

## 2022-04-29 ENCOUNTER — Encounter: Payer: Self-pay | Admitting: Internal Medicine

## 2022-04-29 VITALS — BP 150/100 | HR 78 | Wt 165.8 lb

## 2022-04-29 DIAGNOSIS — E782 Mixed hyperlipidemia: Secondary | ICD-10-CM

## 2022-04-29 DIAGNOSIS — F17200 Nicotine dependence, unspecified, uncomplicated: Secondary | ICD-10-CM

## 2022-04-29 DIAGNOSIS — Z23 Encounter for immunization: Secondary | ICD-10-CM

## 2022-04-29 DIAGNOSIS — Z1211 Encounter for screening for malignant neoplasm of colon: Secondary | ICD-10-CM

## 2022-04-29 DIAGNOSIS — R972 Elevated prostate specific antigen [PSA]: Secondary | ICD-10-CM

## 2022-04-29 DIAGNOSIS — I10 Essential (primary) hypertension: Secondary | ICD-10-CM

## 2022-04-29 MED ORDER — LISINOPRIL 20 MG PO TABS
20.0000 mg | ORAL_TABLET | Freq: Every day | ORAL | 6 refills | Status: DC
  Filled 2022-04-29 – 2022-05-26 (×2): qty 30, 30d supply, fill #0
  Filled 2022-06-28: qty 30, 30d supply, fill #1
  Filled 2022-08-05: qty 30, 30d supply, fill #2
  Filled 2022-09-06: qty 30, 30d supply, fill #3
  Filled 2022-10-02: qty 30, 30d supply, fill #4
  Filled 2022-11-09: qty 30, 30d supply, fill #5
  Filled 2022-12-08: qty 30, 30d supply, fill #6

## 2022-04-29 MED ORDER — ZOSTER VAC RECOMB ADJUVANTED 50 MCG/0.5ML IM SUSR
0.5000 mL | Freq: Once | INTRAMUSCULAR | 0 refills | Status: AC
  Filled 2022-04-29: qty 0.5, 1d supply, fill #0

## 2022-04-29 MED ORDER — AMLODIPINE BESYLATE 5 MG PO TABS
5.0000 mg | ORAL_TABLET | Freq: Every day | ORAL | 6 refills | Status: DC
  Filled 2022-04-29: qty 30, 30d supply, fill #0
  Filled 2022-05-26: qty 30, 30d supply, fill #1
  Filled 2022-06-28: qty 30, 30d supply, fill #2
  Filled 2022-08-05: qty 30, 30d supply, fill #3
  Filled 2022-09-06: qty 30, 30d supply, fill #4
  Filled 2022-10-02: qty 30, 30d supply, fill #5
  Filled 2022-11-09: qty 30, 30d supply, fill #6

## 2022-04-29 MED ORDER — ATORVASTATIN CALCIUM 10 MG PO TABS
10.0000 mg | ORAL_TABLET | Freq: Every day | ORAL | 6 refills | Status: DC
  Filled 2022-04-29 – 2022-05-26 (×2): qty 30, 30d supply, fill #0
  Filled 2022-06-28: qty 30, 30d supply, fill #1
  Filled 2022-08-04: qty 30, 30d supply, fill #2
  Filled 2022-09-06: qty 30, 30d supply, fill #3
  Filled 2022-10-03 – 2022-10-11 (×2): qty 30, 30d supply, fill #4
  Filled 2022-11-09: qty 30, 30d supply, fill #5
  Filled 2022-12-08: qty 30, 30d supply, fill #6

## 2022-04-29 NOTE — Progress Notes (Signed)
Patient ID: Duane Lambert, male    DOB: 07-Nov-1960  MRN: [id number]  CC: Hypertension   Subjective: Duane Lambert is a 61 y.o. male who presents for chronic ds management His concerns today include:  Hx of HTN, HL, elev PSA, tob dep.    HYPERTENSION Currently taking: see medication list.  Took Lisinopril already today Med Adherence: '[x]'$  Yes    '[]'$  No Medication side effects: '[]'$  Yes    '[x]'$  No Adherence with salt restriction: '[x]'$  Yes    '[]'$  No Home Monitoring?: '[]'$  Yes    '[x]'$  No device Monitoring Frequency:  Home BP results range:  SOB? '[]'$  Yes    '[x]'$  No Chest Pain?: '[]'$  Yes    '[x]'$  No Leg swelling?: '[]'$  Yes    '[x]'$  No Headaches?: '[]'$  Yes    '[x]'$  No Dizziness? '[]'$  Yes    '[x]'$  No Comments: taking and tolerating Lipitor  Elevated PSA.  Thought he applied for OC but has not done so  Tob dep:  On last visit, he indicated desire to quit.  He was suppose to purchase nicotine patches OTC but did not.  Smoking1/2 pk/day.  Not ready to quit.  HM:  agrees for flu shot and shingrix.  Due for colon CA screen  Patient Active Problem List   Diagnosis Date Noted   Incarcerated inguinal hernia 12/22/2021   Mixed hyperlipidemia 09/20/2021   Tobacco dependence 09/20/2021   Elevated PSA 09/20/2021   Lipoma of head 07/22/2021   Essential hypertension 04/22/2020   Back pain 04/22/2020   Postop check 09/22/2011     Current Outpatient Medications on File Prior to Visit  Medication Sig Dispense Refill   acetaminophen (TYLENOL) 500 MG tablet Take 2 tablets (1,000 mg total) by mouth every 6 (six) hours as needed for mild pain. 30 tablet 0   amoxicillin-clavulanate (AUGMENTIN) 875-125 MG tablet Take 1 tablet by mouth every 12 (twelve) hours. 14 tablet 0   atorvastatin (LIPITOR) 10 MG tablet Take 1 tablet (10 mg total) by mouth daily. 30 tablet 1   ciprofloxacin (CIPRO) 500 MG tablet Take 1 tablet (500 mg total) by mouth 2 (two) times daily. 14 tablet 0   docusate sodium (COLACE) 100 MG capsule Take 1  capsule (100 mg total) by mouth 2 (two) times daily. 10 capsule 0   ibuprofen (ADVIL) 600 MG tablet Take 1 tablet (600 mg total) by mouth every 8 (eight) hours as needed for mild pain or moderate pain. 21 tablet 0   lisinopril (ZESTRIL) 20 MG tablet Take 1 tablet (20 mg total) by mouth daily. 30 tablet 2   methocarbamol (ROBAXIN) 750 MG tablet Take 1 tablet (750 mg total) by mouth every 6 (six) hours as needed for muscle spasms (pain not relieved by tylenol or advil). 30 tablet 0   polyethylene glycol (MIRALAX / GLYCOLAX) 17 g packet Take 17 g by mouth daily as needed for mild constipation. 14 each 0   No current facility-administered medications on file prior to visit.    No Known Allergies  Social History   Socioeconomic History   Marital status: Single    Spouse name: Not on file   Number of children: Not on file   Years of education: Not on file   Highest education level: Not on file  Occupational History   Not on file  Tobacco Use   Smoking status: Every Day    Packs/day: 1.00    Types: Cigarettes   Smokeless tobacco: Current  Vaping  Use   Vaping Use: Never used  Substance and Sexual Activity   Alcohol use: No   Drug use: Not Currently   Sexual activity: Not Currently  Other Topics Concern   Not on file  Social History Narrative   Not on file   Social Determinants of Health   Financial Resource Strain: Not on file  Food Insecurity: Not on file  Transportation Needs: Not on file  Physical Activity: Not on file  Stress: Not on file  Social Connections: Not on file  Intimate Partner Violence: Not on file    No family history on file.  Past Surgical History:  Procedure Laterality Date   APPENDECTOMY     INGUINAL HERNIA REPAIR Right 12/22/2021   Procedure: OPEN  INGUINAL HERNIA REPAIR POSSIBLE LAPAROTOMY;  Surgeon: Georganna Skeans, MD;  Location: [place];  Service: General;  Laterality: Right;   LAPAROSCOPIC APPENDECTOMY  09/09/2011   Procedure: APPENDECTOMY  LAPAROSCOPIC;  Surgeon: Doreen Salvage, MD;  Location: [place];  Service: General;  Laterality: N/A;    ROS: Review of Systems Negative except as stated above  PHYSICAL EXAM: BP (!) 155/109 (BP Location: Left Arm, Patient Position: Sitting, Cuff Size: Normal)   Pulse 78   Wt 165 lb 12.8 oz (75.2 kg)   SpO2 97%   BMI 25.97 kg/m   Physical Exam   General appearance - alert, well appearing, older African-American male and in no distress Mental status - normal mood, behavior, speech, dress, motor activity, and thought processes Neck - supple, no significant adenopathy Chest - clear to auscultation, no wheezes, rales or rhonchi, symmetric air entry Heart - normal rate, regular rhythm, normal S1, S2, no murmurs, rubs, clicks or gallops Extremities - peripheral pulses normal, no pedal edema, no clubbing or cyanosis     Latest Ref Rng & Units 12/23/2021    1:33 AM 12/22/2021    3:41 AM 11/01/2021    4:30 PM  CMP  Glucose 70 - 99 mg/dL 121  177  118   BUN 6 - 20 mg/dL '9  12  11   '$ Creatinine 0.61 - 1.24 mg/dL 0.80  0.83  0.81   Sodium 135 - 145 mmol/L 134  136  135   Potassium 3.5 - 5.1 mmol/L 4.0  3.7  3.8   Chloride 98 - 111 mmol/L 101  102  100   CO2 22 - 32 mmol/L '24  22  27   '$ Calcium 8.9 - 10.3 mg/dL 8.7  9.8  9.3   Total Protein 6.5 - 8.1 g/dL  8.0  7.3   Total Bilirubin 0.3 - 1.2 mg/dL  0.5  0.7   Alkaline Phos 38 - 126 U/L  75  56   AST 15 - 41 U/L  36  39   ALT 0 - 44 U/L  38  45    Lipid Panel     Component Value Date/Time   CHOL 220 (H) 07/27/2021 1247   TRIG 107 07/27/2021 1247   HDL 42 07/27/2021 1247   CHOLHDL 5.2 (H) 07/27/2021 1247   LDLCALC 159 (H) 07/27/2021 1247    CBC    Component Value Date/Time   WBC 12.6 (H) 12/23/2021 0133   RBC 4.81 12/23/2021 0133   HGB 13.9 12/23/2021 0133   HGB 14.6 07/27/2021 1247   HCT 42.2 12/23/2021 0133   HCT 44.5 07/27/2021 1247   PLT 167 12/23/2021 0133   PLT 195 07/27/2021 1247   MCV 87.7 12/23/2021 0133   MCV  88  07/27/2021 1247   MCH 28.9 12/23/2021 0133   MCHC 32.9 12/23/2021 0133   RDW 14.9 12/23/2021 0133   RDW 14.0 07/27/2021 1247   LYMPHSABS 3.0 11/01/2021 1630   LYMPHSABS 4.1 (H) 07/27/2021 1247   MONOABS 0.6 11/01/2021 1630   EOSABS 0.1 11/01/2021 1630   EOSABS 0.1 07/27/2021 1247   BASOSABS 0.0 11/01/2021 1630   BASOSABS 0.0 07/27/2021 1247    ASSESSMENT AND PLAN:  1. Essential hypertension Not at goal.  Continue lisinopril 20 mg.  Add Norvasc 5 mg daily.  Continue DASH diet.  Follow-up with clinical pharmacist in 2 weeks for repeat blood pressure check. - amLODipine (NORVASC) 5 MG tablet; Take 1 tablet (5 mg total) by mouth daily.  Dispense: 30 tablet; Refill: 6 - lisinopril (ZESTRIL) 20 MG tablet; Take 1 tablet (20 mg total) by mouth daily.  Dispense: 30 tablet; Refill: 6  2. Mixed hyperlipidemia Continue atorvastatin.  We will recheck lipid profile today. - Lipid panel - Hepatic function panel - atorvastatin (LIPITOR) 10 MG tablet; Take 1 tablet (10 mg total) by mouth daily.  Dispense: 30 tablet; Refill: 6  3. Tobacco dependence Strongly advised to quit.  4. Elevated PSA We will recheck PSA to see if his levels have normalized or have continued to increase.  Advised to apply for the orange card/cone discount - PSA  5. Need for influenza vaccination Given today.  6. Screening for colon cancer - Fecal occult blood, imunochemical([facility])  7. Need for shingles vaccine Printed prescription for Shingrix vaccine.  He will take it downstairs to our pharmacy to be given the shot. - Zoster Vaccine Adjuvanted [facility]) injection; Inject 0.5 mLs into the muscle once for 1 dose.  Dispense: 0.5 mL; Refill: 0    Patient was given the opportunity to ask questions.  Patient verbalized understanding of the plan and was able to repeat key elements of the plan.   This documentation was completed using Radio producer.  Any transcriptional errors are  unintentional.  No orders of the defined types were placed in this encounter.    Requested Prescriptions    No prescriptions requested or ordered in this encounter    No follow-ups on file.  Karle Plumber, MD, FACP

## 2022-04-30 LAB — LIPID PANEL
Chol/HDL Ratio: 4.5 ratio (ref 0.0–5.0)
Cholesterol, Total: 165 mg/dL (ref 100–199)
HDL: 37 mg/dL — ABNORMAL LOW (ref >39)
LDL Chol Calc (NIH): 99 mg/dL (ref 0–99)
Triglycerides: 165 mg/dL — ABNORMAL HIGH (ref 0–149)
VLDL Cholesterol Cal: 29 mg/dL (ref 5–40)

## 2022-04-30 LAB — HEPATIC FUNCTION PANEL
ALT: 45 IU/L — ABNORMAL HIGH (ref 0–44)
AST: 38 IU/L (ref 0–40)
Albumin: 4.5 g/dL (ref 3.8–4.9)
Alkaline Phosphatase: 76 IU/L (ref 44–121)
Bilirubin Total: 0.4 mg/dL (ref 0.0–1.2)
Bilirubin, Direct: 0.13 mg/dL (ref 0.00–0.40)
Total Protein: 6.9 g/dL (ref 6.0–8.5)

## 2022-04-30 LAB — PSA: Prostate Specific Ag, Serum: 3.6 ng/mL (ref 0.0–4.0)

## 2022-05-27 ENCOUNTER — Other Ambulatory Visit: Payer: Self-pay

## 2022-06-07 ENCOUNTER — Ambulatory Visit: Payer: Self-pay | Attending: Internal Medicine | Admitting: Pharmacist

## 2022-06-07 VITALS — BP 123/82 | HR 93

## 2022-06-07 DIAGNOSIS — I10 Essential (primary) hypertension: Secondary | ICD-10-CM

## 2022-06-07 MED ORDER — BLOOD PRESSURE CUFF MISC: 0 refills | Status: AC

## 2022-06-07 NOTE — Progress Notes (Cosign Needed)
   S:     No chief complaint on file.  60 y.o. male who presents for hypertension evaluation, education, and management.  PMH is significant for HTN, HLD, elevated PSA, and tobacco use.  Patient was referred and last seen by Primary Care Provider, Dr. Wynetta Emery, on 04/29/2022. Amlodipine was added to his regimen at that visit.   Today, patient arrives in good spirits and presents without assistance. Denies dizziness, headache, blurred vision, swelling.   Patient reports hypertension is longstanding.   Family/Social history:  Fhx: no pertinent positives Tobacco: current 1 PPD smoker Alcohol: none reported  Medication adherence reported. Patient has taken BP medications today.   Current antihypertensives include: lisinopril 20 mg daily, amlodipine 5 mg daily   Antihypertensives tried in the past include: HCTZ (non-adherence)  Reported home BP readings: not checking   Patient reported dietary habits:  -Compliant with salt restriction  -Drinks coffee in the morning  Patient-reported exercise habits:  -Walks daily. Also active at his job.    O:  Vitals:   06/07/22 1349  BP: 123/82  Pulse: 93    Last 3 Office BP readings: BP Readings from Last 3 Encounters:  06/07/22 123/82  04/29/22 (!) 150/100  03/22/22 (!) 171/119   BMET    Component Value Date/Time   NA 134 (L) 12/23/2021 0133   NA 140 07/27/2021 1247   K 4.0 12/23/2021 0133   CL 101 12/23/2021 0133   CO2 24 12/23/2021 0133   GLUCOSE 121 (H) 12/23/2021 0133   BUN 9 12/23/2021 0133   BUN 12 07/27/2021 1247   CREATININE 0.80 12/23/2021 0133   CALCIUM 8.7 (L) 12/23/2021 0133   GFRNONAA >60 12/23/2021 0133   GFRAA >60 07/25/2017 1539    Renal function: CrCl cannot be calculated (Patient's most recent lab result is older than the maximum 21 days allowed.).  Clinical ASCVD: No  The 10-year ASCVD risk score (Arnett DK, et al., 2019) is: 22%   Values used to calculate the score:     Age: 2 years     Sex:  Male     Is Non-Hispanic African American: Yes     Diabetic: No     Tobacco smoker: Yes     Systolic Blood Pressure: 720 mmHg     Is BP treated: Yes     HDL Cholesterol: 37 mg/dL     Total Cholesterol: 165 mg/dL  A/P: Hypertension diagnosed currently at goal on current medications. BP goal < 130/80 mmHg. Medication adherence appears appropriate.  -Continued amlodipine 5 mg daily. -Continued lisinopril 20 mg daily.  -F/u labs ordered - none -Counseled on lifestyle modifications for blood pressure control including reduced dietary sodium, increased exercise, adequate sleep. -Encouraged patient to check BP at home and bring log of readings to next visit. Counseled on proper use of home BP cuff.    Results reviewed and written information provided.    Written patient instructions provided. Patient verbalized understanding of treatment plan.  Total time in face to face counseling 15 minutes.    Follow-up:  Pharmacist 6 weeks. PCP clinic visit in March.   Benard Halsted, PharmD, [place], [place] [phone]

## 2022-06-29 ENCOUNTER — Other Ambulatory Visit: Payer: Self-pay

## 2022-06-30 ENCOUNTER — Other Ambulatory Visit: Payer: Self-pay

## 2022-08-05 ENCOUNTER — Other Ambulatory Visit: Payer: Self-pay

## 2022-08-29 ENCOUNTER — Ambulatory Visit: Payer: Self-pay | Admitting: Internal Medicine

## 2022-08-29 ENCOUNTER — Ambulatory Visit: Payer: Self-pay | Admitting: Pharmacist

## 2022-09-12 ENCOUNTER — Other Ambulatory Visit: Payer: Self-pay

## 2022-09-29 ENCOUNTER — Encounter: Payer: Self-pay | Admitting: Pharmacist

## 2022-09-29 ENCOUNTER — Ambulatory Visit: Payer: Self-pay | Attending: Internal Medicine | Admitting: Pharmacist

## 2022-09-29 VITALS — BP 102/73 | HR 92 | Wt 168.4 lb

## 2022-09-29 DIAGNOSIS — I10 Essential (primary) hypertension: Secondary | ICD-10-CM

## 2022-09-29 NOTE — Progress Notes (Signed)
S:     No chief complaint on file.  62 y.o. male who presents for hypertension evaluation, education, and management.  PMH is significant for HTN, HLD, elevated PSA, and tobacco use.  Patient was referred and last seen by Primary Care Provider, Dr. Wynetta Emery, on 04/29/2022. Amlodipine was added to his regimen at that visit. Patient was last seen by pharmacy on 06/08/2023 and blood pressure was at goal. No changes made at that visit.   Today, patient arrives in good spirits and presents without assistance. Endorses dizziness when he misses his blood pressure medications. Denies headache, blurred vision, swelling. Of note, patient endorses sexual dysfunction but did not elaborate. "I feel embarrassed since you are a lady".  Patient reports hypertension is longstanding.   Family/Social history:  Fhx: no pertinent positives Tobacco: 1/2 PPD (decreased from 1 PPD at previous visit) Alcohol: none reported  Medication adherence reported with lisinopril, amlodipine, and atorvastatin. Reports missed medications 1 time in past week. Patient has taken blood pressure medications today.   Current antihypertensives include: lisinopril 20 mg daily, amlodipine 5 mg daily   Antihypertensives tried in the past include: HCTZ (non-adherence)  Reported home BP readings: none  Patient reported dietary habits:  - Compliant with salt restriction  - Eats meat grilled or baked - Added more vegetables into diet - Drinks one cup coffee in the morning at work  Patient-reported exercise habits:  -Walks daily. Also active at his job.   O:  Vitals:   09/29/22 1356  BP: 102/73  Pulse: 92   Last 3 Office BP readings: BP Readings from Last 3 Encounters:  09/29/22 102/73  06/07/22 123/82  04/29/22 (!) 150/100   BMET    Component Value Date/Time   NA 134 (L) 12/23/2021 0133   NA 140 07/27/2021 1247   K 4.0 12/23/2021 0133   CL 101 12/23/2021 0133   CO2 24 12/23/2021 0133   GLUCOSE 121 (H)  12/23/2021 0133   BUN 9 12/23/2021 0133   BUN 12 07/27/2021 1247   CREATININE 0.80 12/23/2021 0133   CALCIUM 8.7 (L) 12/23/2021 0133   GFRNONAA >60 12/23/2021 0133   GFRAA >60 07/25/2017 1539   Renal function: CrCl cannot be calculated (Patient's most recent lab result is older than the maximum 21 days allowed.).  Clinical ASCVD: No  The 10-year ASCVD risk score (Arnett DK, et al., 2019) is: 16%   Values used to calculate the score:     Age: 89 years     Sex: Male     Is Non-Hispanic African American: Yes     Diabetic: No     Tobacco smoker: Yes     Systolic Blood Pressure: [id number] mmHg     Is BP treated: Yes     HDL Cholesterol: 37 mg/dL     Total Cholesterol: 165 mg/dL  A/P: Hypertension diagnosed currently at goal on current medications. BP goal < 130/80 mmHg. Medication adherence appears appropriate.  -Continued amlodipine 5 mg daily. -Continued lisinopril 20 mg daily.  -F/u labs ordered - none -Counseled on lifestyle modifications for blood pressure control including reduced dietary sodium, increased exercise, adequate sleep. -Encouraged patient to check BP at home and bring log of readings to next visit.   Sexual dysfunction encouraged patient to discuss with PCP at upcoming visit.   Patient verbalized understanding of treatment plan.  Total time in face to face counseling 15 minutes.    Follow-up:  PCP on [id number]  Duane Lambert, [facility] [phone]

## 2022-10-03 ENCOUNTER — Other Ambulatory Visit: Payer: Self-pay

## 2022-10-05 ENCOUNTER — Other Ambulatory Visit: Payer: Self-pay

## 2022-10-11 ENCOUNTER — Other Ambulatory Visit (HOSPITAL_COMMUNITY): Payer: Self-pay

## 2022-10-12 ENCOUNTER — Other Ambulatory Visit: Payer: Self-pay

## 2022-11-10 ENCOUNTER — Other Ambulatory Visit: Payer: Self-pay

## 2022-11-11 ENCOUNTER — Other Ambulatory Visit: Payer: Self-pay

## 2022-11-15 ENCOUNTER — Other Ambulatory Visit: Payer: Self-pay

## 2022-11-15 ENCOUNTER — Encounter: Payer: Self-pay | Admitting: Internal Medicine

## 2022-11-15 ENCOUNTER — Ambulatory Visit: Payer: Self-pay | Attending: Internal Medicine | Admitting: Internal Medicine

## 2022-11-15 VITALS — BP 109/76 | HR 102 | Temp 98.6°F | Ht 67.0 in | Wt 167.0 lb

## 2022-11-15 DIAGNOSIS — Z1211 Encounter for screening for malignant neoplasm of colon: Secondary | ICD-10-CM

## 2022-11-15 DIAGNOSIS — N5201 Erectile dysfunction due to arterial insufficiency: Secondary | ICD-10-CM

## 2022-11-15 DIAGNOSIS — F17200 Nicotine dependence, unspecified, uncomplicated: Secondary | ICD-10-CM

## 2022-11-15 DIAGNOSIS — I10 Essential (primary) hypertension: Secondary | ICD-10-CM

## 2022-11-15 DIAGNOSIS — Z23 Encounter for immunization: Secondary | ICD-10-CM

## 2022-11-15 DIAGNOSIS — E782 Mixed hyperlipidemia: Secondary | ICD-10-CM

## 2022-11-15 MED ORDER — AMLODIPINE BESYLATE 5 MG PO TABS
5.0000 mg | ORAL_TABLET | Freq: Every day | ORAL | 6 refills | Status: DC
  Filled 2022-11-15 – 2022-12-08 (×2): qty 30, 30d supply, fill #0
  Filled 2023-01-05: qty 30, 30d supply, fill #1
  Filled 2023-02-07: qty 30, 30d supply, fill #2
  Filled 2023-03-10: qty 30, 30d supply, fill #3
  Filled 2023-04-08: qty 30, 30d supply, fill #4

## 2022-11-15 MED ORDER — TADALAFIL 10 MG PO TABS
ORAL_TABLET | ORAL | 3 refills | Status: DC
  Filled 2022-11-15: qty 10, 30d supply, fill #0

## 2022-11-15 MED ORDER — ZOSTER VAC RECOMB ADJUVANTED 50 MCG/0.5ML IM SUSR
0.5000 mL | Freq: Once | INTRAMUSCULAR | 0 refills | Status: AC
  Filled 2022-11-15: qty 0.5, 1d supply, fill #0

## 2022-11-15 NOTE — Progress Notes (Signed)
Patient ID: Duane Lambert, male    DOB: Feb 25, 1961  MRN: [id number]  CC: Hypertension (HTN f/u. Med refill. /)   Subjective: Duane Lambert is a 62 y.o. male who presents for chronic ds management His concerns today include:  Hx of HTN, HL, elev PSA, tob dep.    HYPERTENSION Currently taking: see medication list.  On Lisinopril 20 mg daily and Norvasc 5 mg daily Med Adherence: [x]  Yes    []  No Medication side effects: []  Yes    [x]  No Adherence with salt restriction: [x]  Yes    []  No Home Monitoring?: []  Yes    [x]  No Monitoring Frequency:  Home BP results range:  SOB? []  Yes    [x]  No Chest Pain?: []  Yes    [x]  No Leg swelling?: []  Yes    [x]  No Headaches?: []  Yes    [x]  No Dizziness? []  Yes    [x]  No Comments:   HL: Reports compliance with taking atorvastatin 10 mg daily.  Last LDL was 99.  Tob dep:  Still at 1/2 pk/day.  Not ready to quit.  Elevated PSA: We repeated PSA level on last visit.  Previously it was 7 but had declined to 3.6.  Passing urine okay C/o problems getting and maintaining erection since being on BP meds  HM: Due for shingles vaccine #2 and colon cancer screening.  Given FIT test on last visit.  Misplaced it Patient Active Problem List   Diagnosis Date Noted   Incarcerated inguinal hernia 12/22/2021   Mixed hyperlipidemia 09/20/2021   Tobacco dependence 09/20/2021   Elevated PSA 09/20/2021   Lipoma of head 07/22/2021   Essential hypertension 04/22/2020   Back pain 04/22/2020   Postop check 09/22/2011     Current Outpatient Medications on File Prior to Visit  Medication Sig Dispense Refill   acetaminophen (TYLENOL) 500 MG tablet Take 2 tablets (1,000 mg total) by mouth every 6 (six) hours as needed for mild pain. 30 tablet 0   atorvastatin (LIPITOR) 10 MG tablet Take 1 tablet (10 mg total) by mouth daily. 30 tablet 6   Blood Pressure Monitoring (BLOOD PRESSURE CUFF) MISC Use to check blood pressure once daily. 1 each 0   docusate sodium  (COLACE) 100 MG capsule Take 1 capsule (100 mg total) by mouth 2 (two) times daily. 10 capsule 0   lisinopril (ZESTRIL) 20 MG tablet Take 1 tablet (20 mg total) by mouth daily. 30 tablet 6   methocarbamol (ROBAXIN) 750 MG tablet Take 1 tablet (750 mg total) by mouth every 6 (six) hours as needed for muscle spasms (pain not relieved by tylenol or advil). 30 tablet 0   polyethylene glycol (MIRALAX / GLYCOLAX) 17 g packet Take 17 g by mouth daily as needed for mild constipation. 14 each 0   No current facility-administered medications on file prior to visit.    No Known Allergies  Social History   Socioeconomic History   Marital status: Single    Spouse name: Not on file   Number of children: Not on file   Years of education: Not on file   Highest education level: Not on file  Occupational History   Not on file  Tobacco Use   Smoking status: Every Day    Packs/day: 1    Types: Cigarettes   Smokeless tobacco: Current  Vaping Use   Vaping Use: Never used  Substance and Sexual Activity   Alcohol use: No   Drug use: Not Currently  Sexual activity: Not Currently  Other Topics Concern   Not on file  Social History Narrative   Not on file   Social Determinants of Health   Financial Resource Strain: Low Risk  (09/29/2022)   Overall Financial Resource Strain (CARDIA)    Difficulty of Paying Living Expenses: Not very hard  Food Insecurity: No Food Insecurity (09/29/2022)   Hunger Vital Sign    Worried About Running Out of Food in the Last Year: Never true    Ran Out of Food in the Last Year: Never true  Transportation Needs: No Transportation Needs (09/29/2022)   PRAPARE - Administrator, Civil Service (Medical): No    Lack of Transportation (Non-Medical): No  Physical Activity: Inactive (09/29/2022)   Exercise Vital Sign    Days of Exercise per Week: 0 days    Minutes of Exercise per Session: 0 min  Stress: No Stress Concern Present (09/29/2022)   [employer] of  Occupational Health - Occupational Stress Questionnaire    Feeling of Stress : Only a little  Social Connections: Socially Isolated (09/29/2022)   Social Connection and Isolation Panel [NHANES]    Frequency of Communication with Friends and Family: More than three times a week    Frequency of Social Gatherings with Friends and Family: More than three times a week    Attends Religious Services: Never    Database administrator or Organizations: No    Attends Banker Meetings: Never    Marital Status: Never married  Intimate Partner Violence: Not At Risk (09/29/2022)   Humiliation, Afraid, Rape, and Kick questionnaire    Fear of Current or Ex-Partner: No    Emotionally Abused: No    Physically Abused: No    Sexually Abused: No    No family history on file.  Past Surgical History:  Procedure Laterality Date   APPENDECTOMY     INGUINAL HERNIA REPAIR Right 12/22/2021   Procedure: OPEN  INGUINAL HERNIA REPAIR POSSIBLE LAPAROTOMY;  Surgeon: Violeta Gelinas, MD;  Location[facility] OR;  Service: General;  Laterality: Right;   LAPAROSCOPIC APPENDECTOMY  09/09/2011   Procedure: APPENDECTOMY LAPAROSCOPIC;  Surgeon: Frederik Schmidt, MD;  Location: [facility] OR;  Service: General;  Laterality: N/A;    ROS: Review of Systems Negative except as stated above  PHYSICAL EXAM: BP 109/76 (BP Location: Left Arm, Patient Position: Sitting, Cuff Size: Normal)   Pulse (!) 102   Temp 98.6 F (37 C) (Oral)   Ht 5\' 7"  (1.702 m)   Wt 167 lb (75.8 kg)   SpO2 98%   BMI 26.16 kg/m   Physical Exam   General appearance - alert, well appearing, older AAM and in no distress Mental status - normal mood, behavior, speech, dress, motor activity, and thought processes Neck - supple, no significant adenopathy Chest - clear to auscultation, no wheezes, rales or rhonchi, symmetric air entry Heart - normal rate, regular rhythm, normal S1, S2, no murmurs, rubs, clicks or gallops Extremities - peripheral pulses normal, no  pedal edema, no clubbing or cyanosis     Latest Ref Rng & Units 04/29/2022    2:17 PM 12/23/2021    1:33 AM 12/22/2021    3:41 AM  CMP  Glucose 70 - 99 mg/dL  161  096   BUN 6 - 20 mg/dL  9  12   Creatinine 0.45 - 1.24 mg/dL  4.09  8.11   Sodium 914 - 145 mmol/L  134  136   Potassium  3.5 - 5.1 mmol/L  4.0  3.7   Chloride 98 - 111 mmol/L  101  102   CO2 22 - 32 mmol/L  24  22   Calcium 8.9 - 10.3 mg/dL  8.7  9.8   Total Protein 6.0 - 8.5 g/dL 6.9   8.0   Total Bilirubin 0.0 - 1.2 mg/dL 0.4   0.5   Alkaline Phos 44 - 121 IU/L 76   75   AST 0 - 40 IU/L 38   36   ALT 0 - 44 IU/L 45   38    Lipid Panel     Component Value Date/Time   CHOL 165 04/29/2022 1417   TRIG 165 (H) 04/29/2022 1417   HDL 37 (L) 04/29/2022 1417   CHOLHDL 4.5 04/29/2022 1417   LDLCALC 99 04/29/2022 1417    CBC    Component Value Date/Time   WBC 12.6 (H) 12/23/2021 0133   RBC 4.81 12/23/2021 0133   HGB 13.9 12/23/2021 0133   HGB 14.6 07/27/2021 1247   HCT 42.2 12/23/2021 0133   HCT 44.5 07/27/2021 1247   PLT 167 12/23/2021 0133   PLT 195 07/27/2021 1247   MCV 87.7 12/23/2021 0133   MCV 88 07/27/2021 1247   MCH 28.9 12/23/2021 0133   MCHC 32.9 12/23/2021 0133   RDW 14.9 12/23/2021 0133   RDW 14.0 07/27/2021 1247   LYMPHSABS 3.0 11/01/2021 1630   LYMPHSABS 4.1 (H) 07/27/2021 1247   MONOABS 0.6 11/01/2021 1630   EOSABS 0.1 11/01/2021 1630   EOSABS 0.1 07/27/2021 1247   BASOSABS 0.0 11/01/2021 1630   BASOSABS 0.0 07/27/2021 1247    ASSESSMENT AND PLAN: 1. Essential hypertension At goal.  Continue amlodipine 5 mg daily. - amLODipine (NORVASC) 5 MG tablet; Take 1 tablet (5 mg total) by mouth daily.  Dispense: 30 tablet; Refill: 6  2. Mixed hyperlipidemia Last LDL at goal.  Continue atorvastatin.  3. Tobacco dependence Strongly encouraged him to quit smoking.  Discussed HEALTH risks associated with smoking.  4. Erectile dysfunction due to arterial insufficiency Patient wanting to try  Cialis. Pt advised of possible side effects of Cialis medication including prolong erection, flushing, headaches, stuff nose and sudden vision and hearing changes.  Pt told to be seen in ER if he has erection lasting longer than 3-4 hrs, or if he has sudden vision changes or hearing loss. Advised to take the medication about half hour to an hour before intercourse as needed. - tadalafil (CIALIS) 10 MG tablet; 1 tab PO 1/2-1 hr prior to intercourse PRN.  Limit use to 1 tab/24 hr period  Dispense: 10 tablet; Refill: 3  5. Need for shingles vaccine Patient agreeable to receiving second Shingrix vaccine.  Printed prescription given to him to take to the pharmacy today. - Zoster Vaccine Adjuvanted [facility]) injection; Inject 0.5 mLs into the muscle once for 1 dose.  Dispense: 0.5 mL; Refill: 0  6. Screening for colon cancer We have given him another stool kit.  He was unable to give a sample today.  Advised that if he takes it home, he needs to return it to the lab the same day that it is used. - Fecal occult blood, imunochemical([facility])     Patient was given the opportunity to ask questions.  Patient verbalized understanding of the plan and was able to repeat key elements of the plan.   This documentation was completed using Paediatric nurse.  Any transcriptional errors are unintentional.  Orders Placed  This Encounter  Procedures   Fecal occult blood, imunochemical([facility])     Requested Prescriptions   Signed Prescriptions Disp Refills   tadalafil (CIALIS) 10 MG tablet 10 tablet 3    Sig: Take 1 tablet by mouth 1/2-1 hour prior to intercourse as needed. Do not take more than 1 tablet in 24 hours.   amLODipine (NORVASC) 5 MG tablet 30 tablet 6    Sig: Take 1 tablet (5 mg total) by mouth daily.   Zoster Vaccine Adjuvanted [facility]) injection 0.5 mL 0    Sig: Inject 0.5 mLs into the muscle once for 1 dose.    No follow-ups on file.  Jonah Blue, MD, FACP

## 2022-12-08 ENCOUNTER — Other Ambulatory Visit: Payer: Self-pay

## 2022-12-09 ENCOUNTER — Other Ambulatory Visit: Payer: Self-pay

## 2023-01-05 ENCOUNTER — Other Ambulatory Visit: Payer: Self-pay | Admitting: Internal Medicine

## 2023-01-05 ENCOUNTER — Other Ambulatory Visit: Payer: Self-pay

## 2023-01-05 DIAGNOSIS — I10 Essential (primary) hypertension: Secondary | ICD-10-CM

## 2023-01-05 DIAGNOSIS — E782 Mixed hyperlipidemia: Secondary | ICD-10-CM

## 2023-01-05 MED ORDER — ATORVASTATIN CALCIUM 10 MG PO TABS
10.0000 mg | ORAL_TABLET | Freq: Every day | ORAL | 0 refills | Status: DC
  Filled 2023-01-05: qty 30, 30d supply, fill #0
  Filled 2023-02-07: qty 30, 30d supply, fill #1
  Filled 2023-03-10: qty 30, 30d supply, fill #2

## 2023-01-05 MED ORDER — LISINOPRIL 20 MG PO TABS
20.0000 mg | ORAL_TABLET | Freq: Every day | ORAL | 0 refills | Status: DC
  Filled 2023-01-05: qty 30, 30d supply, fill #0
  Filled 2023-02-07: qty 30, 30d supply, fill #1
  Filled 2023-03-10: qty 30, 30d supply, fill #2

## 2023-01-06 ENCOUNTER — Other Ambulatory Visit: Payer: Self-pay

## 2023-01-10 ENCOUNTER — Other Ambulatory Visit: Payer: Self-pay

## 2023-02-09 ENCOUNTER — Other Ambulatory Visit: Payer: Self-pay

## 2023-03-10 ENCOUNTER — Other Ambulatory Visit: Payer: Self-pay

## 2023-03-27 ENCOUNTER — Other Ambulatory Visit: Payer: Self-pay

## 2023-03-27 ENCOUNTER — Ambulatory Visit: Payer: Self-pay | Attending: Internal Medicine | Admitting: Internal Medicine

## 2023-03-27 ENCOUNTER — Encounter: Payer: Self-pay | Admitting: Internal Medicine

## 2023-03-27 VITALS — BP 120/81 | HR 87 | Temp 98.4°F | Ht 67.0 in | Wt 169.0 lb

## 2023-03-27 DIAGNOSIS — E782 Mixed hyperlipidemia: Secondary | ICD-10-CM

## 2023-03-27 DIAGNOSIS — I10 Essential (primary) hypertension: Secondary | ICD-10-CM

## 2023-03-27 DIAGNOSIS — N5201 Erectile dysfunction due to arterial insufficiency: Secondary | ICD-10-CM

## 2023-03-27 DIAGNOSIS — Z2821 Immunization not carried out because of patient refusal: Secondary | ICD-10-CM

## 2023-03-27 DIAGNOSIS — F17200 Nicotine dependence, unspecified, uncomplicated: Secondary | ICD-10-CM

## 2023-03-27 DIAGNOSIS — D2330 Other benign neoplasm of skin of unspecified part of face: Secondary | ICD-10-CM

## 2023-03-27 DIAGNOSIS — Z23 Encounter for immunization: Secondary | ICD-10-CM

## 2023-03-27 MED ORDER — ZOSTER VAC RECOMB ADJUVANTED 50 MCG/0.5ML IM SUSR: 0.5000 mL | Freq: Once | INTRAMUSCULAR | 0 refills | Status: AC

## 2023-03-27 MED ORDER — TADALAFIL 20 MG PO TABS
ORAL_TABLET | ORAL | 6 refills | Status: DC
  Filled 2023-03-27 – 2023-05-10 (×2): qty 10, 30d supply, fill #0
  Filled 2023-06-12: qty 10, 10d supply, fill #0

## 2023-03-27 NOTE — Progress Notes (Signed)
Patient ID: DREY SHAFF, male    DOB: [birth year]  MRN: [id number]  CC: Hypertension (HTN f/u./Reports minimizing smoking to 1/2 a day/No to flu vax. Yes to shingles vax. )   Subjective: Duane Lambert is a 62 y.o. male who presents for chronic ds management. His concerns today include:  Hx of HTN, HL, elev PSA, tob dep.   ED:  please with Cialis but feels he needs higher dose.    HYPERTENSION Currently taking: see medication list.  On Lisinopril 20 mg daily and Norvasc 5 mg daily.  Took meds already for the morning Med Adherence: [x]  Yes    []  No Medication side effects: []  Yes    [x]  No Adherence with salt restriction: [x]  Yes    []  No Home Monitoring?: []  Yes    [x]  No Monitoring Frequency:  Home BP results range:  SOB? []  Yes    [x]  No Chest Pain?: []  Yes    [x]  No Leg swelling?: []  Yes    [x]  No Headaches?: []  Yes    [x]  No Dizziness? []  Yes    [x]  No Comments:   Tob dep:  1/2 pk a day.  Say he is trying to quit.  Patient Active Problem List   Diagnosis Date Noted   Incarcerated inguinal hernia 12/22/2021   Mixed hyperlipidemia 09/20/2021   Tobacco dependence 09/20/2021   Elevated PSA 09/20/2021   Lipoma of head 07/22/2021   Essential hypertension 04/22/2020   Back pain 04/22/2020   Postop check 09/22/2011     Current Outpatient Medications on File Prior to Visit  Medication Sig Dispense Refill   acetaminophen (TYLENOL) 500 MG tablet Take 2 tablets (1,000 mg total) by mouth every 6 (six) hours as needed for mild pain. 30 tablet 0   amLODipine (NORVASC) 5 MG tablet Take 1 tablet (5 mg total) by mouth daily. 30 tablet 6   atorvastatin (LIPITOR) 10 MG tablet Take 1 tablet (10 mg total) by mouth daily. 90 tablet 0   Blood Pressure Monitoring (BLOOD PRESSURE CUFF) MISC Use to check blood pressure once daily. 1 each 0   docusate sodium (COLACE) 100 MG capsule Take 1 capsule (100 mg total) by mouth 2 (two) times daily. 10 capsule 0   lisinopril (ZESTRIL) 20 MG  tablet Take 1 tablet (20 mg total) by mouth daily. 90 tablet 0   polyethylene glycol (MIRALAX / GLYCOLAX) 17 g packet Take 17 g by mouth daily as needed for mild constipation. 14 each 0   methocarbamol (ROBAXIN) 750 MG tablet Take 1 tablet (750 mg total) by mouth every 6 (six) hours as needed for muscle spasms (pain not relieved by tylenol or advil). (Patient not taking: Reported on 03/27/2023) 30 tablet 0   No current facility-administered medications on file prior to visit.    No Known Allergies  Social History   Socioeconomic History   Marital status: Single    Spouse name: Not on file   Number of children: Not on file   Years of education: Not on file   Highest education level: Not on file  Occupational History   Not on file  Tobacco Use   Smoking status: Every Day    Current packs/day: 1.00    Types: Cigarettes   Smokeless tobacco: Current  Vaping Use   Vaping status: Never Used  Substance and Sexual Activity   Alcohol use: No   Drug use: Not Currently   Sexual activity: Not Currently  Other Topics Concern  Not on file  Social History Narrative   Not on file   Social Determinants of Health   Financial Resource Strain: Low Risk  (09/29/2022)   Overall Financial Resource Strain (CARDIA)    Difficulty of Paying Living Expenses: Not very hard  Food Insecurity: No Food Insecurity (09/29/2022)   Hunger Vital Sign    Worried About Running Out of Food in the Last Year: Never true    Ran Out of Food in the Last Year: Never true  Transportation Needs: No Transportation Needs (09/29/2022)   PRAPARE - Administrator, Civil Service (Medical): No    Lack of Transportation (Non-Medical): No  Physical Activity: Inactive (09/29/2022)   Exercise Vital Sign    Days of Exercise per Week: 0 days    Minutes of Exercise per Session: 0 min  Stress: No Stress Concern Present (09/29/2022)   [employer] of Occupational Health - Occupational Stress Questionnaire    Feeling of  Stress : Only a little  Social Connections: Socially Isolated (09/29/2022)   Social Connection and Isolation Panel [NHANES]    Frequency of Communication with Friends and Family: More than three times a week    Frequency of Social Gatherings with Friends and Family: More than three times a week    Attends Religious Services: Never    Database administrator or Organizations: No    Attends Banker Meetings: Never    Marital Status: Never married  Intimate Partner Violence: Not At Risk (09/29/2022)   Humiliation, Afraid, Rape, and Kick questionnaire    Fear of Current or Ex-Partner: No    Emotionally Abused: No    Physically Abused: No    Sexually Abused: No    No family history on file.  Past Surgical History:  Procedure Laterality Date   APPENDECTOMY     INGUINAL HERNIA REPAIR Right 12/22/2021   Procedure: OPEN  INGUINAL HERNIA REPAIR POSSIBLE LAPAROTOMY;  Surgeon: Violeta Gelinas, MD;  Location: [facility] OR;  Service: General;  Laterality: Right;   LAPAROSCOPIC APPENDECTOMY  09/09/2011   Procedure: APPENDECTOMY LAPAROSCOPIC;  Surgeon: Frederik Schmidt, MD;  Location: [facility] OR;  Service: General;  Laterality: N/A;    ROS: Review of Systems Negative except as stated above  PHYSICAL EXAM: BP 120/81   Pulse 87   Temp 98.4 F (36.9 C) (Oral)   Ht 5\' 7"  (1.702 m)   Wt 169 lb (76.7 kg)   SpO2 97%   BMI 26.47 kg/m   Physical Exam   General appearance - alert, well appearing, and in no distress Mental status - normal mood, behavior, speech, dress, motor activity, and thought processes Neck - supple, no significant adenopathy Chest - clear to auscultation, no wheezes, rales or rhonchi, symmetric air entry Heart - normal rate, regular rhythm, normal S1, S2, no murmurs, rubs, clicks or gallops Extremities - peripheral pulses normal, no pedal edema, no clubbing or cyanosis Skin: Soft, raised, movable mass on the lateral corner of the right eye which has been there for some time.  It is  about 2 cm in size.  He would like to have it removed.  Currently uninsured.    Latest Ref Rng & Units 04/29/2022    2:17 PM 12/23/2021    1:33 AM 12/22/2021    3:41 AM  CMP  Glucose 70 - 99 mg/dL  161  096   BUN 6 - 20 mg/dL  9  12   Creatinine 0.45 - 1.24 mg/dL  4.09  0.83   Sodium 135 - 145 mmol/L  134  136   Potassium 3.5 - 5.1 mmol/L  4.0  3.7   Chloride 98 - 111 mmol/L  101  102   CO2 22 - 32 mmol/L  24  22   Calcium 8.9 - 10.3 mg/dL  8.7  9.8   Total Protein 6.0 - 8.5 g/dL 6.9   8.0   Total Bilirubin 0.0 - 1.2 mg/dL 0.4   0.5   Alkaline Phos 44 - 121 IU/L 76   75   AST 0 - 40 IU/L 38   36   ALT 0 - 44 IU/L 45   38    Lipid Panel     Component Value Date/Time   CHOL 165 04/29/2022 1417   TRIG 165 (H) 04/29/2022 1417   HDL 37 (L) 04/29/2022 1417   CHOLHDL 4.5 04/29/2022 1417   LDLCALC 99 04/29/2022 1417    CBC    Component Value Date/Time   WBC 12.6 (H) 12/23/2021 0133   RBC 4.81 12/23/2021 0133   HGB 13.9 12/23/2021 0133   HGB 14.6 07/27/2021 1247   HCT 42.2 12/23/2021 0133   HCT 44.5 07/27/2021 1247   PLT 167 12/23/2021 0133   PLT 195 07/27/2021 1247   MCV 87.7 12/23/2021 0133   MCV 88 07/27/2021 1247   MCH 28.9 12/23/2021 0133   MCHC 32.9 12/23/2021 0133   RDW 14.9 12/23/2021 0133   RDW 14.0 07/27/2021 1247   LYMPHSABS 3.0 11/01/2021 1630   LYMPHSABS 4.1 (H) 07/27/2021 1247   MONOABS 0.6 11/01/2021 1630   EOSABS 0.1 11/01/2021 1630   EOSABS 0.1 07/27/2021 1247   BASOSABS 0.0 11/01/2021 1630   BASOSABS 0.0 07/27/2021 1247    ASSESSMENT AND PLAN:  1. Erectile dysfunction due to arterial insufficiency Patient reports improvement with Cialis but would like a higher dose.  We will increase from 10 mg daily to 20 mg daily.  Went over possible side effects of the medication with him again.  Advised to be seen in the emergency room if he develops erection lasting longer than 2 hours or sudden vision or hearing changes.  He expressed understanding. - tadalafil  (CIALIS) 20 MG tablet; Take 1 tablet by mouth 1/2-1 hour prior to intercourse as needed. Do not take more than 1 tablet in 24 hours.  Dispense: 10 tablet; Refill: 6  2. Essential hypertension Close to goal.  Continue amlodipine 5 mg daily and lisinopril 20 mg daily.  3. Mixed hyperlipidemia Continue atorvastatin 10 mg daily  4. Tobacco dependence Advised to quit.  Patient states he is trying to quit.  Discussed ways to help him quit.  Patient declines medications.  States he will continue to work on it.  Encouraged him to set a quit date.  5. Cyst, dermoid, face Advised to apply for Medicaid.  Once approved he should let me know so that we can refer to dermatology.  6. Influenza vaccination declined   7. Need for shingles vaccine - Zoster Vaccine Adjuvanted [facility]) injection; Inject 0.5 mLs into the muscle once for 1 dose.  Dispense: 0.5 mL; Refill: 0          Patient was given the opportunity to ask questions.  Patient verbalized understanding of the plan and was able to repeat key elements of the plan.   This documentation was completed using Paediatric nurse.  Any transcriptional errors are unintentional.  No orders of the defined types were placed in this encounter.  Requested Prescriptions   Signed Prescriptions Disp Refills   tadalafil (CIALIS) 20 MG tablet 10 tablet 6    Sig: Take 1 tablet by mouth 1/2-1 hour prior to intercourse as needed. Do not take more than 1 tablet in 24 hours.   Zoster Vaccine Adjuvanted [facility]) injection 0.5 mL 0    Sig: Inject 0.5 mLs into the muscle once for 1 dose.    Return in about 4 months (around 07/27/2023) for chronic ds management.  Jonah Blue, MD, FACP

## 2023-03-28 ENCOUNTER — Other Ambulatory Visit: Payer: Self-pay

## 2023-03-31 ENCOUNTER — Other Ambulatory Visit: Payer: Self-pay | Admitting: Internal Medicine

## 2023-03-31 DIAGNOSIS — Z1211 Encounter for screening for malignant neoplasm of colon: Secondary | ICD-10-CM

## 2023-03-31 DIAGNOSIS — Z1212 Encounter for screening for malignant neoplasm of rectum: Secondary | ICD-10-CM

## 2023-04-08 ENCOUNTER — Other Ambulatory Visit: Payer: Self-pay | Admitting: Internal Medicine

## 2023-04-08 DIAGNOSIS — I10 Essential (primary) hypertension: Secondary | ICD-10-CM

## 2023-04-08 DIAGNOSIS — E782 Mixed hyperlipidemia: Secondary | ICD-10-CM

## 2023-04-10 ENCOUNTER — Other Ambulatory Visit: Payer: Self-pay

## 2023-04-10 NOTE — Telephone Encounter (Signed)
Requested by interface surescripts. Last dispensed doc. 03/10/23 #90. Too soon for refill. Requested Prescriptions  Refused Prescriptions Disp Refills   atorvastatin (LIPITOR) 10 MG tablet 90 tablet 0    Sig: Take 1 tablet (10 mg total) by mouth daily.     Cardiovascular:  Antilipid - Statins Failed - 04/08/2023  5:41 PM      Failed - Lipid Panel in normal range within the last 12 months    Cholesterol, Total  Date Value Ref Range Status  04/29/2022 165 100 - 199 mg/dL Final   LDL Chol Calc (NIH)  Date Value Ref Range Status  04/29/2022 99 0 - 99 mg/dL Final   HDL  Date Value Ref Range Status  04/29/2022 37 (L) >39 mg/dL Final   Triglycerides  Date Value Ref Range Status  04/29/2022 165 (H) 0 - 149 mg/dL Final         Passed - Patient is not pregnant      Passed - Valid encounter within last 12 months    Recent Outpatient Visits           2 weeks ago Essential hypertension   [place] [facility] Marcine Matar, MD   4 months ago Essential hypertension   [place] [facility] Marcine Matar, MD   6 months ago Essential hypertension   [facility] [place], Cornelius Moras, RPH-CPP   10 months ago Essential hypertension   [facility] [place], Cornelius Moras, RPH-CPP   11 months ago Essential hypertension   [place] [facility] Marcine Matar, MD       Future Appointments             In 3 months Marcine Matar, MD [facility]             lisinopril (ZESTRIL) 20 MG tablet 90 tablet 0    Sig: Take 1 tablet (20 mg total) by mouth daily.     Cardiovascular:  ACE Inhibitors Failed - 04/08/2023  5:41 PM      Failed - Cr in normal range and within 180 days    Creatinine, Ser  Date Value Ref Range Status  12/23/2021 0.80 0.61 - 1.24 mg/dL Final         Failed - K in normal  range and within 180 days    Potassium  Date Value Ref Range Status  12/23/2021 4.0 3.5 - 5.1 mmol/L Final         Passed - Patient is not pregnant      Passed - Last BP in normal range    BP Readings from Last 1 Encounters:  03/27/23 120/81         Passed - Valid encounter within last 6 months    Recent Outpatient Visits           2 weeks ago Essential hypertension   [place] [facility] Marcine Matar, MD   4 months ago Essential hypertension    [facility] Marcine Matar, MD   6 months ago Essential hypertension   [facility] [place], Cornelius Moras, RPH-CPP   10 months ago Essential hypertension   [facility] L, RPH-CPP   11 months ago Essential hypertension  [facility] Marcine Matar, MD       Future Appointments             In 3 months Laural Benes Binnie Rail, MD [facility]

## 2023-04-11 ENCOUNTER — Other Ambulatory Visit: Payer: Self-pay

## 2023-04-11 ENCOUNTER — Other Ambulatory Visit: Payer: Self-pay | Admitting: Internal Medicine

## 2023-04-11 ENCOUNTER — Other Ambulatory Visit: Payer: Self-pay | Admitting: Pharmacist

## 2023-04-11 DIAGNOSIS — E782 Mixed hyperlipidemia: Secondary | ICD-10-CM

## 2023-04-11 DIAGNOSIS — I10 Essential (primary) hypertension: Secondary | ICD-10-CM

## 2023-04-11 MED ORDER — LISINOPRIL 20 MG PO TABS
20.0000 mg | ORAL_TABLET | Freq: Every day | ORAL | 1 refills | Status: DC
  Filled 2023-04-11: qty 30, 30d supply, fill #0
  Filled 2023-05-08 (×2): qty 30, 30d supply, fill #1
  Filled 2023-06-11: qty 30, 30d supply, fill #2
  Filled 2023-07-12: qty 30, 30d supply, fill #3
  Filled 2023-08-13: qty 30, 30d supply, fill #4
  Filled 2023-09-10: qty 30, 30d supply, fill #5

## 2023-04-11 MED ORDER — ATORVASTATIN CALCIUM 10 MG PO TABS
10.0000 mg | ORAL_TABLET | Freq: Every day | ORAL | 1 refills | Status: DC
  Filled 2023-04-11: qty 30, 30d supply, fill #0
  Filled 2023-05-08 (×2): qty 30, 30d supply, fill #1
  Filled 2023-06-11: qty 30, 30d supply, fill #2
  Filled 2023-07-12: qty 30, 30d supply, fill #3
  Filled 2023-08-13: qty 30, 30d supply, fill #4
  Filled 2023-09-10: qty 30, 30d supply, fill #5

## 2023-04-11 MED ORDER — AMLODIPINE BESYLATE 5 MG PO TABS
5.0000 mg | ORAL_TABLET | Freq: Every day | ORAL | 0 refills | Status: DC
  Filled 2023-05-08: qty 30, 30d supply, fill #0
  Filled 2023-06-11: qty 30, 30d supply, fill #1
  Filled 2023-07-12: qty 30, 30d supply, fill #2

## 2023-04-14 ENCOUNTER — Other Ambulatory Visit: Payer: Self-pay

## 2023-05-08 ENCOUNTER — Other Ambulatory Visit: Payer: Self-pay

## 2023-05-10 ENCOUNTER — Other Ambulatory Visit: Payer: Self-pay

## 2023-05-11 ENCOUNTER — Other Ambulatory Visit: Payer: Self-pay

## 2023-05-22 ENCOUNTER — Other Ambulatory Visit: Payer: Self-pay

## 2023-05-30 ENCOUNTER — Encounter (HOSPITAL_COMMUNITY): Payer: Self-pay

## 2023-05-30 ENCOUNTER — Other Ambulatory Visit: Payer: Self-pay

## 2023-05-30 ENCOUNTER — Emergency Department (HOSPITAL_COMMUNITY)
Admission: EM | Admit: 2023-05-30 | Discharge: 2023-05-30 | Disposition: A | Discharge status: Other Acute Inpatient Hospital | Payer: Self-pay | Attending: Emergency Medicine | Admitting: Emergency Medicine

## 2023-05-30 ENCOUNTER — Emergency Department (HOSPITAL_COMMUNITY): Payer: Self-pay

## 2023-05-30 DIAGNOSIS — M272 Inflammatory conditions of jaws: Secondary | ICD-10-CM

## 2023-05-30 DIAGNOSIS — R6884 Jaw pain: Secondary | ICD-10-CM | POA: Diagnosis not present

## 2023-05-30 DIAGNOSIS — Z79899 Other long term (current) drug therapy: Secondary | ICD-10-CM | POA: Insufficient documentation

## 2023-05-30 DIAGNOSIS — K047 Periapical abscess without sinus: Secondary | ICD-10-CM

## 2023-05-30 DIAGNOSIS — I10 Essential (primary) hypertension: Secondary | ICD-10-CM | POA: Insufficient documentation

## 2023-05-30 DIAGNOSIS — K0889 Other specified disorders of teeth and supporting structures: Secondary | ICD-10-CM | POA: Insufficient documentation

## 2023-05-30 LAB — COMPREHENSIVE METABOLIC PANEL
ALT: 25 U/L (ref 0–44)
AST: 32 U/L (ref 15–41)
Albumin: 3.8 g/dL (ref 3.5–5.0)
Alkaline Phosphatase: 71 U/L (ref 38–126)
Anion gap: 11 (ref 5–15)
BUN: 9 mg/dL (ref 8–23)
CO2: 20 mmol/L — ABNORMAL LOW (ref 22–32)
Calcium: 9.3 mg/dL (ref 8.9–10.3)
Chloride: 98 mmol/L (ref 98–111)
Creatinine, Ser: 0.78 mg/dL (ref 0.61–1.24)
GFR, Estimated: >60 mL/min (ref >60)
Glucose, Bld: 109 mg/dL — ABNORMAL HIGH (ref 70–99)
Potassium: 4 mmol/L (ref 3.5–5.1)
Sodium: 129 mmol/L — ABNORMAL LOW (ref 135–145)
Total Bilirubin: 1.2 mg/dL — ABNORMAL HIGH (ref <1.2)
Total Protein: 7.9 g/dL (ref 6.5–8.1)

## 2023-05-30 LAB — CBC WITH DIFFERENTIAL/PLATELET
Abs Immature Granulocytes: 0.02 10*3/uL (ref 0.00–0.07)
Basophils Absolute: 0 10*3/uL (ref 0.0–0.1)
Basophils Relative: 0 %
Eosinophils Absolute: 0 10*3/uL (ref 0.0–0.5)
Eosinophils Relative: 0 %
HCT: 43 % (ref 39.0–52.0)
Hemoglobin: 14.7 g/dL (ref 13.0–17.0)
Immature Granulocytes: 0 %
Lymphocytes Relative: 18 %
Lymphs Abs: 1.7 10*3/uL (ref 0.7–4.0)
MCH: 29.1 pg (ref 26.0–34.0)
MCHC: 34.2 g/dL (ref 30.0–36.0)
MCV: 85 fL (ref 80.0–100.0)
Monocytes Absolute: 0.9 10*3/uL (ref 0.1–1.0)
Monocytes Relative: 10 %
Neutro Abs: 6.9 10*3/uL (ref 1.7–7.7)
Neutrophils Relative %: 72 %
Platelets: 190 10*3/uL (ref 150–400)
RBC: 5.06 MIL/uL (ref 4.22–5.81)
RDW: 13.7 % (ref 11.5–15.5)
WBC: 9.5 10*3/uL (ref 4.0–10.5)
nRBC: 0 % (ref 0.0–0.2)

## 2023-05-30 MED ORDER — IOHEXOL 350 MG/ML SOLN
75.0000 mL | Freq: Once | INTRAVENOUS | Status: AC | PRN
  Administered 2023-05-30: 75 mL via INTRAVENOUS

## 2023-05-30 MED ORDER — CLINDAMYCIN PHOSPHATE 300 MG/50ML IV SOLN: 300.0000 mg | Freq: Four times a day (QID) | INTRAVENOUS | Status: DC

## 2023-05-30 MED ORDER — CLINDAMYCIN PHOSPHATE 600 MG/50ML IV SOLN
600.0000 mg | Freq: Once | INTRAVENOUS | Status: AC
  Administered 2023-05-30: 600 mg via INTRAVENOUS
  Filled 2023-05-30 (×2): qty 50

## 2023-05-30 MED ORDER — DOCUSATE SODIUM 100 MG PO CAPS
100.0000 mg | ORAL_CAPSULE | Freq: Two times a day (BID) | ORAL | Status: DC
  Administered 2023-05-30: 100 mg via ORAL
  Filled 2023-05-30: qty 1

## 2023-05-30 MED ORDER — AMLODIPINE BESYLATE 5 MG PO TABS
5.0000 mg | ORAL_TABLET | Freq: Every day | ORAL | Status: DC
Start: 2023-05-30 — End: 2023-05-30
  Administered 2023-05-30: 5 mg via ORAL
  Filled 2023-05-30: qty 1

## 2023-05-30 MED ORDER — ACETAMINOPHEN 500 MG PO TABS
1000.0000 mg | ORAL_TABLET | Freq: Four times a day (QID) | ORAL | Status: DC | PRN
  Administered 2023-05-30: 1000 mg via ORAL
  Filled 2023-05-30: qty 2

## 2023-05-30 MED ORDER — LACTATED RINGERS IV BOLUS
1000.0000 mL | Freq: Once | INTRAVENOUS | Status: AC
  Administered 2023-05-30: 1000 mL via INTRAVENOUS

## 2023-05-30 MED ORDER — ATORVASTATIN CALCIUM 10 MG PO TABS
10.0000 mg | ORAL_TABLET | Freq: Every day | ORAL | Status: DC
  Administered 2023-05-30: 10 mg via ORAL
  Filled 2023-05-30: qty 1

## 2023-05-30 MED ORDER — FENTANYL CITRATE PF 50 MCG/ML IJ SOSY
50.0000 ug | PREFILLED_SYRINGE | Freq: Once | INTRAMUSCULAR | Status: AC
  Administered 2023-05-30: 50 ug via INTRAVENOUS
  Filled 2023-05-30: qty 1

## 2023-05-30 MED ORDER — OXYCODONE-ACETAMINOPHEN 5-325 MG PO TABS
1.0000 | ORAL_TABLET | Freq: Once | ORAL | Status: AC
  Administered 2023-05-30: 1 via ORAL
  Filled 2023-05-30: qty 1

## 2023-05-30 MED ORDER — MORPHINE SULFATE (PF) 4 MG/ML IV SOLN
4.0000 mg | Freq: Once | INTRAVENOUS | Status: AC
  Administered 2023-05-30: 4 mg via INTRAVENOUS
  Filled 2023-05-30: qty 1

## 2023-05-30 MED ORDER — SODIUM CHLORIDE 0.9 % IV SOLN
3.0000 g | Freq: Four times a day (QID) | INTRAVENOUS | Status: DC
  Administered 2023-05-30: 3 g via INTRAVENOUS
  Filled 2023-05-30 (×2): qty 8

## 2023-05-30 MED ORDER — LISINOPRIL 20 MG PO TABS
20.0000 mg | ORAL_TABLET | Freq: Every day | ORAL | Status: DC
  Administered 2023-05-30: 20 mg via ORAL
  Filled 2023-05-30: qty 1

## 2023-05-30 MED ORDER — MORPHINE SULFATE (PF) 4 MG/ML IV SOLN
4.0000 mg | INTRAVENOUS | Status: DC | PRN
  Administered 2023-05-30: 4 mg via INTRAVENOUS
  Filled 2023-05-30: qty 1

## 2023-05-30 NOTE — ED Provider Notes (Signed)
[place] EMERGENCY DEPARTMENT AT [facility] Provider Note   CSN: [id number] Arrival date & time: 05/30/23  0444     History  Chief Complaint  Patient presents with   Jaw Pain   Dental Pain    Duane Lambert is a 62 y.o. male.  Patient with a history of hypertension here with 2 days of lower jaw pain.  Denies any fall or injury.  Complains of pain mostly the left side of his jaw with swelling of his chin and lower jaw area.  Denies any difficulty breathing but does have some difficulty swallowing.  No fever, chills, nausea, vomiting, chest pain or shortness of breath.  No history of diabetes.  Denies any injury to his tooth. No bleeding or drainage.  The history is provided by the patient.  Dental Pain Associated symptoms: no congestion, no fever and no headaches        Home Medications Prior to Admission medications   Medication Sig Start Date End Date Taking? Authorizing Provider  acetaminophen (TYLENOL) 500 MG tablet Take 2 tablets (1,000 mg total) by mouth every 6 (six) hours as needed for mild pain. 12/23/21   Adam Phenix, PA-C  amLODipine (NORVASC) 5 MG tablet Take 1 tablet (5 mg total) by mouth daily. 04/11/23   Marcine Matar, MD  atorvastatin (LIPITOR) 10 MG tablet Take 1 tablet (10 mg total) by mouth daily. 04/11/23   Marcine Matar, MD  Blood Pressure Monitoring (BLOOD PRESSURE CUFF) MISC Use to check blood pressure once daily. 06/07/22   Marcine Matar, MD  docusate sodium (COLACE) 100 MG capsule Take 1 capsule (100 mg total) by mouth 2 (two) times daily. 12/23/21   Meuth, Brooke A, PA-C  lisinopril (ZESTRIL) 20 MG tablet Take 1 tablet (20 mg total) by mouth daily. 04/11/23   Marcine Matar, MD  polyethylene glycol (MIRALAX / GLYCOLAX) 17 g packet Take 17 g by mouth daily as needed for mild constipation. 12/23/21   Meuth, Brooke A, PA-C  tadalafil (CIALIS) 20 MG tablet Take 1 tablet by mouth 1/2-1 hour prior to intercourse as needed.  Do not take more than 1 tablet in 24 hours. 03/27/23   Marcine Matar, MD      Allergies    Patient has no known allergies.    Review of Systems   Review of Systems  Constitutional:  Negative for activity change, appetite change and fever.  HENT:  Positive for dental problem and trouble swallowing. Negative for congestion.   Respiratory:  Negative for cough, chest tightness and shortness of breath.   Gastrointestinal:  Negative for abdominal pain, nausea and vomiting.  Genitourinary:  Negative for dysuria and hematuria.  Musculoskeletal:  Negative for arthralgias and myalgias.  Skin:  Negative for rash.  Neurological:  Negative for dizziness, weakness and headaches.   all other systems are negative except as noted in the HPI and PMH.    Physical Exam Updated Vital Signs BP (!) 147/81   Pulse (!) 102   Temp 98.2 F (36.8 C) (Oral)   Resp 13   Ht 5\' 7"  (1.702 m)   Wt 76.4 kg   SpO2 98%   BMI 26.38 kg/m  Physical Exam Vitals and nursing note reviewed.  Constitutional:      General: He is not in acute distress.    Appearance: He is well-developed.  HENT:     Head: Normocephalic and atraumatic.     Mouth/Throat:     Pharynx:  No oropharyngeal exudate.     Comments: Diffuse swelling of the lower mandible and chin.  There is tenderness along the left lower incisor without discrete fluctuance.  Some induration along lower gingiva.  Floor of mouth is soft.  No trismus or malocclusion.  Controlling secretions. Eyes:     Conjunctiva/sclera: Conjunctivae normal.     Pupils: Pupils are equal, round, and reactive to light.  Neck:     Comments: No meningismus. Cardiovascular:     Rate and Rhythm: Normal rate and regular rhythm.     Heart sounds: Normal heart sounds. No murmur heard. Pulmonary:     Effort: Pulmonary effort is normal. No respiratory distress.     Breath sounds: Normal breath sounds.  Abdominal:     Palpations: Abdomen is soft.     Tenderness: There is no  abdominal tenderness. There is no guarding or rebound.  Musculoskeletal:        General: No tenderness. Normal range of motion.     Cervical back: Normal range of motion and neck supple.  Skin:    General: Skin is warm.  Neurological:     Mental Status: He is alert and oriented to person, place, and time.     Cranial Nerves: No cranial nerve deficit.     Motor: No abnormal muscle tone.     Coordination: Coordination normal.     Comments:  5/5 strength throughout. CN 2-12 intact.Equal grip strength.   Psychiatric:        Behavior: Behavior normal.     ED Results / Procedures / Treatments   Labs (all labs ordered are listed, but only abnormal results are displayed) Labs Reviewed  CBC WITH DIFFERENTIAL/PLATELET  COMPREHENSIVE METABOLIC PANEL    EKG None  Radiology No results found.  Procedures Procedures    Medications Ordered in ED Medications  clindamycin (CLEOCIN) IVPB 600 mg (has no administration in time range)  lactated ringers bolus 1,000 mL (has no administration in time range)  fentaNYL (SUBLIMAZE) injection 50 mcg (has no administration in time range)    ED Course/ Medical Decision Making/ A&P                                 Medical Decision Making Amount and/or Complexity of Data Reviewed Labs: ordered. Decision-making details documented in ED Course. Radiology: ordered and independent interpretation performed. Decision-making details documented in ED Course. ECG/medicine tests: ordered and independent interpretation performed. Decision-making details documented in ED Course.  Risk Prescription drug management.   Lower dental pain and swelling.  No evidence of Ludwig's angina.  Controlling secretions.  Vitals signs are stable.  Airway is patent.  Controlling secretions.  No difficulty breathing or difficulty swallowing.  Patient will be initiated on IV antibiotics, labs will be obtained.  CT scan to be obtained to evaluate for dental abscess versus  deep space infection.  Care to be Transferred at shift change.       Final Clinical Impression(s) / ED Diagnoses Final diagnoses:  None    Rx / DC Orders ED Discharge Orders     None         Brick Ketcher, Jeannett Senior, MD 05/30/23 [phone]

## 2023-05-30 NOTE — ED Notes (Signed)
Notified MD of increased swelling to lower jaw.  Patient still has clear airway and no trouble breathing

## 2023-05-30 NOTE — ED Notes (Signed)
Vitals deleted from this chart as they were logged for the wrong pt, from the dinamap in triage.

## 2023-05-30 NOTE — ED Triage Notes (Signed)
Pt arrived via Pov c/o bottom jaw pain and bottom dental pain 10/10 on pain scale. Bottom jaw and chin noted to be very edematous. Pt having difficulty swallowing. Denies any trouble breathing

## 2023-05-30 NOTE — ED Provider Notes (Signed)
  Physical Exam  BP (!) 147/81   Pulse (!) 102   Temp 98.2 F (36.8 C) (Oral)   Resp 13   Ht 5\' 7"  (1.702 m)   Wt 76.4 kg   SpO2 98%   BMI 26.38 kg/m   Physical Exam  Procedures  Procedures  ED Course / MDM    Medical Decision Making Amount and/or Complexity of Data Reviewed Labs: ordered. Radiology: ordered.  Risk Prescription drug management.   Duane Lambert, assumed care for this patient.  In brief 62 year old male here today with dental pain.  Patient was signed out pending CT imaging of the max face.  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  Spoke with our on-call dental surgeon Dr. Vashti Hey who after discussing the patient recommended consulting OMFS physician.  I spoke with Dr. Mauri Pole at [facility].  After reviewing the patient's imaging and discussing the patient, he was the patient would be safe for outpatient follow-up and he will see him in his office tomorrow.  Unfortunately, the patient was unable to get to [facility].  I then reached out to [facility], and spoke with Dr. Darrol Jump who graciously except the patient as a transfer.  They do not yet have a bed.  Patient will board in our emergency department until they have a bed available.  Patient has 5 days of clindamycin, and 5 days of morphine as needed pain meds ordered.  Ideally he will not need 5 days of these.      Duane Simmonds T, DO 05/31/23 [phone]

## 2023-06-12 ENCOUNTER — Other Ambulatory Visit: Payer: Self-pay

## 2023-07-13 ENCOUNTER — Other Ambulatory Visit: Payer: Self-pay

## 2023-07-27 ENCOUNTER — Ambulatory Visit: Payer: 59 | Attending: Internal Medicine | Admitting: Internal Medicine

## 2023-07-27 ENCOUNTER — Encounter: Payer: Self-pay | Admitting: Internal Medicine

## 2023-07-27 VITALS — BP 124/78 | HR 89 | Ht 67.0 in | Wt 170.0 lb

## 2023-07-27 DIAGNOSIS — I10 Essential (primary) hypertension: Secondary | ICD-10-CM

## 2023-07-27 DIAGNOSIS — E782 Mixed hyperlipidemia: Secondary | ICD-10-CM | POA: Diagnosis not present

## 2023-07-27 DIAGNOSIS — D2330 Other benign neoplasm of skin of unspecified part of face: Secondary | ICD-10-CM

## 2023-07-27 DIAGNOSIS — F17210 Nicotine dependence, cigarettes, uncomplicated: Secondary | ICD-10-CM

## 2023-07-27 DIAGNOSIS — Z1211 Encounter for screening for malignant neoplasm of colon: Secondary | ICD-10-CM

## 2023-07-27 DIAGNOSIS — Z125 Encounter for screening for malignant neoplasm of prostate: Secondary | ICD-10-CM

## 2023-07-27 DIAGNOSIS — F17200 Nicotine dependence, unspecified, uncomplicated: Secondary | ICD-10-CM

## 2023-07-27 NOTE — Progress Notes (Signed)
Patient ID: TIEN SPOONER, male    DOB: Feb 26, 1961  MRN: [id number]  CC: Medical Management of Chronic Issues (Referral to general surgery)   Subjective: Duane Lambert is a 63 y.o. male who presents for chronic ds management. His concerns today include:  Hx of HTN, HL, elev PSA, tob dep.   Now has insurance. Wants cyst removed from RT side of face that has been there for a while.  HTN: On Lisinopril 20 mg daily and Norvasc 5 mg daily.  Took meds already for the morning  Limits salt.  No device to check BP No CP/SOB/LE edema  Tob dep: still at 1/2 pk/day.  Not ready to quit  HL:  taking and tolerating Lipitor 10 mg daily  Had dental abscess with osteomylitis jaw the early part of last month.  He was seen in the emergency room and then transferred to [facility].  He was discharged on Augmentin.  He has since seen the dentist and has had decayed tooth removed from the lower jaw.  Denies any jaw pain or problems chewing since then  HM: due for 2nd Shingrix and PCV - wants to come back next wk to have these done instead of having them done today.  Has cologuard that was ordered on last visit but has not used it as yet.  Prefers c-scope Patient Active Problem List   Diagnosis Date Noted   Incarcerated inguinal hernia 12/22/2021   Mixed hyperlipidemia 09/20/2021   Tobacco dependence 09/20/2021   Elevated PSA 09/20/2021   Lipoma of head 07/22/2021   Essential hypertension 04/22/2020   Back pain 04/22/2020   Postop check 09/22/2011     Current Outpatient Medications on File Prior to Visit  Medication Sig Dispense Refill   acetaminophen (TYLENOL) 500 MG tablet Take 2 tablets (1,000 mg total) by mouth every 6 (six) hours as needed for mild pain. 30 tablet 0   amLODipine (NORVASC) 5 MG tablet Take 1 tablet (5 mg total) by mouth daily. 90 tablet 0   atorvastatin (LIPITOR) 10 MG tablet Take 1 tablet (10 mg total) by mouth daily. 90 tablet 1   Blood Pressure Monitoring  (BLOOD PRESSURE CUFF) MISC Use to check blood pressure once daily. 1 each 0   docusate sodium (COLACE) 100 MG capsule Take 1 capsule (100 mg total) by mouth 2 (two) times daily. 10 capsule 0   lisinopril (ZESTRIL) 20 MG tablet Take 1 tablet (20 mg total) by mouth daily. 90 tablet 1   polyethylene glycol (MIRALAX / GLYCOLAX) 17 g packet Take 17 g by mouth daily as needed for mild constipation. 14 each 0   tadalafil (CIALIS) 20 MG tablet Take 1 tablet by mouth 1/2-1 hour prior to intercourse as needed. Do not take more than 1 tablet in 24 hours. 10 tablet 6   No current facility-administered medications on file prior to visit.    No Known Allergies  Social History   Socioeconomic History   Marital status: Single    Spouse name: Not on file   Number of children: Not on file   Years of education: Not on file   Highest education level: Not on file  Occupational History   Not on file  Tobacco Use   Smoking status: Every Day    Current packs/day: 1.00    Types: Cigarettes   Smokeless tobacco: Current  Vaping Use   Vaping status: Never Used  Substance and Sexual Activity   Alcohol use: No  Drug use: Not Currently   Sexual activity: Not Currently  Other Topics Concern   Not on file  Social History Narrative   Not on file   Social Drivers of Health   Financial Resource Strain: Low Risk  (07/27/2023)   Overall Financial Resource Strain (CARDIA)    Difficulty of Paying Living Expenses: Not hard at all  Food Insecurity: No Food Insecurity (07/27/2023)   Hunger Vital Sign    Worried About Running Out of Food in the Last Year: Never true    Ran Out of Food in the Last Year: Never true  Transportation Needs: No Transportation Needs (07/27/2023)   PRAPARE - Administrator, Civil Service (Medical): No    Lack of Transportation (Non-Medical): No  Physical Activity: Inactive (07/27/2023)   Exercise Vital Sign    Days of Exercise per Week: 0 days    Minutes of Exercise per  Session: 0 min  Stress: No Stress Concern Present (07/27/2023)   [employer] of Occupational Health - Occupational Stress Questionnaire    Feeling of Stress : Not at all  Social Connections: Socially Isolated (07/27/2023)   Social Connection and Isolation Panel [NHANES]    Frequency of Communication with Friends and Family: Three times a week    Frequency of Social Gatherings with Friends and Family: Three times a week    Attends Religious Services: Never    Active Member of Clubs or Organizations: No    Attends Banker Meetings: Never    Marital Status: Never married  Intimate Partner Violence: Not At Risk (07/27/2023)   Humiliation, Afraid, Rape, and Kick questionnaire    Fear of Current or Ex-Partner: No    Emotionally Abused: No    Physically Abused: No    Sexually Abused: No    No family history on file.  Past Surgical History:  Procedure Laterality Date   APPENDECTOMY     INGUINAL HERNIA REPAIR Right 12/22/2021   Procedure: OPEN  INGUINAL HERNIA REPAIR POSSIBLE LAPAROTOMY;  Surgeon: Violeta Gelinas, MD;  [facility]: [facility] OR;  Service: General;  Laterality: Right;   LAPAROSCOPIC APPENDECTOMY  09/09/2011   Procedure: APPENDECTOMY LAPAROSCOPIC;  Surgeon: Frederik Schmidt, MD;  Location: [facility] OR;  Service: General;  Laterality: N/A;    ROS: Review of Systems Negative except as stated above  PHYSICAL EXAM: BP 124/78   Pulse 89   Ht 5\' 7"  (1.702 m)   Wt 170 lb (77.1 kg)   SpO2 97%   BMI 26.63 kg/m   Wt Readings from Last 3 Encounters:  07/27/23 170 lb (77.1 kg)  05/30/23 168 lb 6.9 oz (76.4 kg)  03/27/23 169 lb (76.7 kg)  ' Physical Exam   General appearance - alert, well appearing, and in no distress Mental status - normal mood, behavior, speech, dress, motor activity, and thought processes Chest - clear to auscultation, no wheezes, rales or rhonchi, symmetric air entry Heart - normal rate, regular rhythm, normal S1, S2, no murmurs, rubs, clicks or  gallops Skin - 2 cm soft movable raised cyst lateral RT eye     Latest Ref Rng & Units 05/30/2023    6:05 AM 04/29/2022    2:17 PM 12/23/2021    1:33 AM  CMP  Glucose 70 - 99 mg/dL 578   469   BUN 8 - 23 mg/dL 9   9   Creatinine 6.29 - 1.24 mg/dL 5.28   4.13   Sodium 244 - 145 mmol/L 129   134  Potassium 3.5 - 5.1 mmol/L 4.0   4.0   Chloride 98 - 111 mmol/L 98   101   CO2 22 - 32 mmol/L 20   24   Calcium 8.9 - 10.3 mg/dL 9.3   8.7   Total Protein 6.5 - 8.1 g/dL 7.9  6.9    Total Bilirubin <1.2 mg/dL 1.2  0.4    Alkaline Phos 38 - 126 U/L 71  76    AST 15 - 41 U/L 32  38    ALT 0 - 44 U/L 25  45     Lipid Panel     Component Value Date/Time   CHOL 165 04/29/2022 1417   TRIG 165 (H) 04/29/2022 1417   HDL 37 (L) 04/29/2022 1417   CHOLHDL 4.5 04/29/2022 1417   LDLCALC 99 04/29/2022 1417    CBC    Component Value Date/Time   WBC 9.5 05/30/2023 0605   RBC 5.06 05/30/2023 0605   HGB 14.7 05/30/2023 0605   HGB 14.6 07/27/2021 1247   HCT 43.0 05/30/2023 0605   HCT 44.5 07/27/2021 1247   PLT 190 05/30/2023 0605   PLT 195 07/27/2021 1247   MCV 85.0 05/30/2023 0605   MCV 88 07/27/2021 1247   MCH 29.1 05/30/2023 0605   MCHC 34.2 05/30/2023 0605   RDW 13.7 05/30/2023 0605   RDW 14.0 07/27/2021 1247   LYMPHSABS 1.7 05/30/2023 0605   LYMPHSABS 4.1 (H) 07/27/2021 1247   MONOABS 0.9 05/30/2023 0605   EOSABS 0.0 05/30/2023 0605   EOSABS 0.1 07/27/2021 1247   BASOSABS 0.0 05/30/2023 0605   BASOSABS 0.0 07/27/2021 1247    ASSESSMENT AND PLAN: 1. Essential hypertension (Primary) At goal.  Continue Norvasc 5 mg daily and lisinopril 20 mg daily.  2. Tobacco dependence Strongly advised to quit.  Patient not ready to give a trial of quitting.  3. Mixed hyperlipidemia Continue atorvastatin 10 mg daily  4. Cyst, dermoid, face - Ambulatory referral to Dermatology  5. Screening for colon cancer - Ambulatory referral to Gastroenterology  6. Prostate cancer screening - PSA;  Future   Patient was given the opportunity to ask questions.  Patient verbalized understanding of the plan and was able to repeat key elements of the plan.   This documentation was completed using Paediatric nurse.  Any transcriptional errors are unintentional.  No orders of the defined types were placed in this encounter.    Requested Prescriptions    No prescriptions requested or ordered in this encounter    No follow-ups on file.  Jonah Blue, MD, FACP

## 2023-08-03 ENCOUNTER — Ambulatory Visit: Payer: 59

## 2023-08-13 ENCOUNTER — Other Ambulatory Visit: Payer: Self-pay | Admitting: Internal Medicine

## 2023-08-13 DIAGNOSIS — I10 Essential (primary) hypertension: Secondary | ICD-10-CM

## 2023-08-14 ENCOUNTER — Other Ambulatory Visit: Payer: Self-pay

## 2023-08-14 MED ORDER — AMLODIPINE BESYLATE 5 MG PO TABS
5.0000 mg | ORAL_TABLET | Freq: Every day | ORAL | 0 refills | Status: DC
  Filled 2023-08-14: qty 30, 30d supply, fill #0
  Filled 2023-09-10: qty 30, 30d supply, fill #1
  Filled 2023-10-11: qty 30, 30d supply, fill #2

## 2023-08-15 ENCOUNTER — Other Ambulatory Visit: Payer: Self-pay

## 2023-08-31 ENCOUNTER — Encounter (HOSPITAL_COMMUNITY): Payer: Self-pay

## 2023-08-31 ENCOUNTER — Other Ambulatory Visit: Payer: Self-pay

## 2023-08-31 ENCOUNTER — Ambulatory Visit (HOSPITAL_COMMUNITY)
Admission: EM | Admit: 2023-08-31 | Discharge: 2023-08-31 | Disposition: A | Discharge status: Home / Self Care | Attending: Family Medicine | Admitting: Family Medicine

## 2023-08-31 DIAGNOSIS — J069 Acute upper respiratory infection, unspecified: Secondary | ICD-10-CM | POA: Diagnosis not present

## 2023-08-31 MED ORDER — FLUTICASONE PROPIONATE 50 MCG/ACT NA SUSP
1.0000 | Freq: Every day | NASAL | 0 refills | Status: DC
  Filled 2023-08-31: qty 16, 30d supply, fill #0

## 2023-08-31 NOTE — ED Provider Notes (Signed)
 [facility]    CSN: [id number] Arrival date & time: 08/31/23  1028      History   Chief Complaint Chief Complaint  Patient presents with   Cough   Headache    HPI Duane Lambert is a 63 y.o. male.   Duane Lambert is a 63 year old male who has had congestion, headaches, mild cough and rhinorrhea since Tuesday.  He has tried over-the-counter allergy medication which has not helped.  He has not tried any other oral medications.  He denies any fevers, chills, vision changes, ringing in the ears or hearing loss, ear pain, sore throat, stiff neck, chest pain, shortness of breath, wheezing, nausea, vomiting, diarrhea, or rashes.  He is drinking 1-2 bottles of water a day but has otherwise been eating well.  The history is provided by the patient.  Cough Associated symptoms: headaches and rhinorrhea   Associated symptoms: no chest pain, no chills, no ear pain, no fever, no myalgias, no rash, no shortness of breath, no sore throat and no wheezing   Headache Associated symptoms: congestion, cough, drainage and sinus pressure   Associated symptoms: no diarrhea, no dizziness, no ear pain, no fatigue, no fever, no hearing loss, no myalgias, no nausea, no neck pain, no neck stiffness, no numbness, no photophobia, no sore throat, no vomiting and no weakness     Past Medical History:  Diagnosis Date   Headache    Hypertension     Patient Active Problem List   Diagnosis Date Noted   Incarcerated inguinal hernia 12/22/2021   Mixed hyperlipidemia 09/20/2021   Tobacco dependence 09/20/2021   Elevated PSA 09/20/2021   Lipoma of head 07/22/2021   Essential hypertension 04/22/2020   Back pain 04/22/2020   Postop check 09/22/2011    Past Surgical History:  Procedure Laterality Date   APPENDECTOMY     INGUINAL HERNIA REPAIR Right 12/22/2021   Procedure: OPEN  INGUINAL HERNIA REPAIR POSSIBLE LAPAROTOMY;  Surgeon: Violeta Gelinas, MD;  Location: [facility] OR;  Service: General;  Laterality:  Right;   LAPAROSCOPIC APPENDECTOMY  09/09/2011   Procedure: APPENDECTOMY LAPAROSCOPIC;  Surgeon: Frederik Schmidt, MD;  Location: [facility] OR;  Service: General;  Laterality: N/A;       Home Medications    Prior to Admission medications   Medication Sig Start Date End Date Taking? Authorizing Provider  acetaminophen (TYLENOL) 500 MG tablet Take 2 tablets (1,000 mg total) by mouth every 6 (six) hours as needed for mild pain. 12/23/21  Yes Simaan, Francine Graven, PA-C  amLODipine (NORVASC) 5 MG tablet Take 1 tablet (5 mg total) by mouth daily. 08/14/23  Yes Marcine Matar, MD  atorvastatin (LIPITOR) 10 MG tablet Take 1 tablet (10 mg total) by mouth daily. 04/11/23  Yes Marcine Matar, MD  Blood Pressure Monitoring (BLOOD PRESSURE CUFF) MISC Use to check blood pressure once daily. 06/07/22  Yes Marcine Matar, MD  docusate sodium (COLACE) 100 MG capsule Take 1 capsule (100 mg total) by mouth 2 (two) times daily. 12/23/21  Yes Meuth, Brooke A, PA-C  fluticasone (FLONASE) 50 MCG/ACT nasal spray Place 1 spray into both nostrils daily. 08/31/23  Yes Ivor Messier, MD  lisinopril (ZESTRIL) 20 MG tablet Take 1 tablet (20 mg total) by mouth daily. 04/11/23  Yes Marcine Matar, MD  polyethylene glycol (MIRALAX / GLYCOLAX) 17 g packet Take 17 g by mouth daily as needed for mild constipation. 12/23/21  Yes Meuth, Brooke A, PA-C  tadalafil (CIALIS) 20 MG tablet Take  1 tablet by mouth 1/2-1 hour prior to intercourse as needed. Do not take more than 1 tablet in 24 hours. 03/27/23  Yes Marcine Matar, MD    Family History History reviewed. No pertinent family history.  Social History Social History   Tobacco Use   Smoking status: Every Day    Current packs/day: 1.00    Types: Cigarettes   Smokeless tobacco: Current  Vaping Use   Vaping status: Never Used  Substance Use Topics   Alcohol use: No   Drug use: Not Currently     Allergies   Patient has no known allergies.   Review of  Systems Review of Systems  Constitutional:  Negative for appetite change, chills, fatigue and fever.  HENT:  Positive for congestion, postnasal drip, rhinorrhea, sinus pressure and sinus pain. Negative for ear pain, facial swelling, hearing loss, mouth sores, sneezing, sore throat, tinnitus, trouble swallowing and voice change.   Eyes:  Negative for photophobia and visual disturbance.  Respiratory:  Positive for cough. Negative for shortness of breath and wheezing.   Cardiovascular:  Negative for chest pain and palpitations.  Gastrointestinal:  Negative for diarrhea, nausea and vomiting.  Musculoskeletal:  Negative for arthralgias, myalgias, neck pain and neck stiffness.  Skin:  Negative for rash.  Neurological:  Positive for headaches. Negative for dizziness, speech difficulty, weakness, light-headedness and numbness.  Psychiatric/Behavioral:  Negative for confusion.      Physical Exam Triage Vital Signs ED Triage Vitals  Encounter Vitals Group     BP 08/31/23 1214 (!) 132/92     Systolic BP Percentile --      Diastolic BP Percentile --      Pulse Rate 08/31/23 1214 100     Resp 08/31/23 1214 18     Temp 08/31/23 1214 98 F (36.7 C)     Temp src --      SpO2 08/31/23 1214 95 %     Weight 08/31/23 1214 170 lb (77.1 kg)     Height 08/31/23 1214 5\' 6"  (1.676 m)     Head Circumference --      Peak Flow --      Pain Score 08/31/23 1213 6     Pain Loc --      Pain Education --      Exclude from Growth Chart --    No data found.  Updated Vital Signs BP (!) 132/92 (BP Location: Left Wrist)   Pulse 100   Temp 98 F (36.7 C)   Resp 18   Ht 5\' 6"  (1.676 m)   Wt 77.1 kg   SpO2 95%   BMI 27.44 kg/m   Visual Acuity Right Eye Distance:   Left Eye Distance:   Bilateral Distance:    Right Eye Near:   Left Eye Near:    Bilateral Near:     Physical Exam Constitutional:      General: He is not in acute distress.    Appearance: He is well-developed and normal weight. He is  not ill-appearing, toxic-appearing or diaphoretic.  HENT:     Head: Normocephalic and atraumatic.     Right Ear: Tympanic membrane normal. There is no impacted cerumen.     Left Ear: Tympanic membrane normal. There is no impacted cerumen.     Ears:     Comments: Bilateral clear effusions    Nose: Congestion present.     Mouth/Throat:     Mouth: Mucous membranes are moist.     Comments: Posterior  oropharyngeal erythema without exudate Copious clear postnasal drip No tonsillar edema All structures midline No oral lesions Eyes:     General:        Right eye: No discharge.        Left eye: No discharge.     Extraocular Movements: Extraocular movements intact.     Pupils: Pupils are equal, round, and reactive to light.  Pulmonary:     Effort: Pulmonary effort is normal.  Musculoskeletal:     Cervical back: Normal range of motion and neck supple.  Skin:    General: Skin is warm.     Capillary Refill: Capillary refill takes more than 3 seconds.     Coloration: Skin is not jaundiced.     Findings: No rash.  Neurological:     General: No focal deficit present.     Mental Status: He is alert.     Sensory: No sensory deficit.     Coordination: Coordination normal.     Gait: Gait normal.  Psychiatric:        Mood and Affect: Mood normal.        Behavior: Behavior normal.        Thought Content: Thought content normal.      UC Treatments / Results  Labs (all labs ordered are listed, but only abnormal results are displayed) Labs Reviewed - No data to display  EKG   Radiology No results found.  Procedures Procedures (including critical care time)  Medications Ordered in UC Medications - No data to display  Initial Impression / Assessment and Plan / UC Course  I have reviewed the triage vital signs and the nursing notes.  Pertinent labs & imaging results that were available during my care of the patient were reviewed by me and considered in my medical decision making  (see chart for details).    URI, stable - The patient is stable with no respiratory distress.  - We discussed symptomatic management.  - The patient can use daily fluticasone and saline nasal spray for congestion. They can use an oral antihistamine or guaifenesin with increased hydration as well. Hot showers for humidified air and use a bedside humidifier can also benefit if available.  - The patient does have some chronic dehydration and he agrees to increase his oral hydration. - I encouraged proper intake of fruits, vegetables, and protein as well of plenty of rest.  - We discussed the use of masking in public areas to avoid spread.  - Return criteria discussed. The patient agreed with the plan and voiced understanding. All questions were answered.   Final Clinical Impressions(s) / UC Diagnoses   Final diagnoses:  Viral upper respiratory tract infection     Discharge Instructions      You have an upper respiratory infection. Most cases are due to a virus and do not require antibiotics for treatment.  Make sure to continue good oral hydration.  You can take tylenol for fever as needed Start Flonase daily for the next week and then as needed. You can use nasal saline spray multiple times daily as well.  You can use a daily antihistamine or guaifenesin as an expectorant but you need to be well hydrated for these medications to work.  Get adequate rest for recovery Maintain distance from others and wear a mask in public areas to avoid spread  If you start to experience shortness of breath, fevers that don't respond to medication, confusion, profound neck stiffness, or fainting, return to the  urgent care or ED.       ED Prescriptions     Medication Sig Dispense Auth. Provider   fluticasone (FLONASE) 50 MCG/ACT nasal spray Place 1 spray into both nostrils daily. 16 g Ivor Messier, MD      PDMP not reviewed this encounter.   Ivor Messier, MD 08/31/23 [phone]

## 2023-08-31 NOTE — Discharge Instructions (Signed)
 You have an upper respiratory infection. Most cases are due to a virus and do not require antibiotics for treatment.  Make sure to continue good oral hydration.  You can take tylenol for fever as needed Start Flonase daily for the next week and then as needed. You can use nasal saline spray multiple times daily as well.  You can use a daily antihistamine or guaifenesin as an expectorant but you need to be well hydrated for these medications to work.  Get adequate rest for recovery Maintain distance from others and wear a mask in public areas to avoid spread  If you start to experience shortness of breath, fevers that don't respond to medication, confusion, profound neck stiffness, or fainting, return to the urgent care or ED.

## 2023-08-31 NOTE — ED Triage Notes (Signed)
 Chief Complaint: sinus pain and pressure, headache, runny nose, sneezing and coughing. Patient denies a history of allergies.   Sick exposure: No  Onset: this past Tuesday   Prescriptions or OTC medications tried: Yes- tylenol     with no relief  New foods, medications, or products: No  Recent Travel: No

## 2023-09-01 ENCOUNTER — Other Ambulatory Visit: Payer: Self-pay

## 2023-09-11 ENCOUNTER — Other Ambulatory Visit: Payer: Self-pay

## 2023-10-11 ENCOUNTER — Other Ambulatory Visit: Payer: Self-pay | Admitting: Internal Medicine

## 2023-10-11 DIAGNOSIS — I10 Essential (primary) hypertension: Secondary | ICD-10-CM

## 2023-10-11 DIAGNOSIS — E782 Mixed hyperlipidemia: Secondary | ICD-10-CM

## 2023-10-12 ENCOUNTER — Other Ambulatory Visit: Payer: Self-pay

## 2023-10-12 MED ORDER — ATORVASTATIN CALCIUM 10 MG PO TABS
10.0000 mg | ORAL_TABLET | Freq: Every day | ORAL | 0 refills | Status: DC
  Filled 2023-10-12: qty 30, 30d supply, fill #0
  Filled 2023-11-09: qty 30, 30d supply, fill #1
  Filled 2023-12-14: qty 30, 30d supply, fill #2

## 2023-10-12 MED ORDER — LISINOPRIL 20 MG PO TABS
20.0000 mg | ORAL_TABLET | Freq: Every day | ORAL | 0 refills | Status: DC
  Filled 2023-10-12: qty 30, 30d supply, fill #0
  Filled 2023-11-09: qty 30, 30d supply, fill #1
  Filled 2023-12-14: qty 30, 30d supply, fill #2

## 2023-10-12 NOTE — Telephone Encounter (Signed)
 Requested medication (s) are due for refill today: yes  Requested medication (s) are on the active medication list: yes  Last refill:  04/11/23 #90/1  Future visit scheduled: yes  Notes to clinic:  Unable to refill per protocol due to failed labs, no updated results.      Requested Prescriptions  Pending Prescriptions Disp Refills   atorvastatin (LIPITOR) 10 MG tablet 90 tablet 1    Sig: Take 1 tablet (10 mg total) by mouth daily.     Cardiovascular:  Antilipid - Statins Failed - 10/12/2023  5:16 PM      Failed - Lipid Panel in normal range within the last 12 months    Cholesterol, Total  Date Value Ref Range Status  04/29/2022 165 100 - 199 mg/dL Final   LDL Chol Calc (NIH)  Date Value Ref Range Status  04/29/2022 99 0 - 99 mg/dL Final   HDL  Date Value Ref Range Status  04/29/2022 37 (L) >39 mg/dL Final   Triglycerides  Date Value Ref Range Status  04/29/2022 165 (H) 0 - 149 mg/dL Final         Passed - Patient is not pregnant      Passed - Valid encounter within last 12 months    Recent Outpatient Visits           2 months ago Essential hypertension   [place] [facility] [place] [place]. [facility] Marcine Matar, MD   6 months ago Essential hypertension   [place] [facility] [place] [facility] Dept Of [place]. [facility] Marcine Matar, MD   11 months ago Essential hypertension   [place] [facility] [place] [facility] [place]. [facility] Marcine Matar, MD   1 year ago Essential hypertension   [place] [facility] [place] [facility] Dept Of [place]. [facility] Drucilla Chalet, RPH-CPP   1 year ago Essential hypertension   [place] [facility] [place] [facility] Dept Of [place]. [facility] Drucilla Chalet, RPH-CPP       Future Appointments             In 1 month Laural Benes, Binnie Rail, MD [facility] [place] - A Dept Of Eligha Bridegroom. [facility]             Signed Prescriptions Disp Refills   lisinopril (ZESTRIL) 20 MG tablet 90 tablet 0    Sig: Take 1 tablet (20 mg total) by mouth daily.     Cardiovascular:  ACE Inhibitors Failed - 10/12/2023  5:16 PM      Failed - Last BP in normal range    BP Readings from Last 1 Encounters:  08/31/23 (!) 132/92         Passed - Cr in normal range and within 180 days    Creatinine, Ser  Date Value Ref Range Status  05/30/2023 0.78 0.61 - 1.24 mg/dL Final         Passed - K in normal range and within 180 days    Potassium  Date Value Ref Range Status  05/30/2023 4.0 3.5 - 5.1 mmol/L Final         Passed - Patient is not pregnant      Passed - Valid encounter within last 6 months    Recent Outpatient Visits  2 months ago Essential hypertension   [place] [facility] [place] [facility] Dept Of [place]. [facility] Lawrance Presume, MD   6 months ago Essential hypertension   [place] [facility] [place] - [facility] Dept Of [place]. [facility] Lawrance Presume, MD   11 months ago Essential hypertension   [place] [facility] Dept Of [place]. [facility] Lawrance Presume, MD   1 year ago Essential hypertension   [place] [facility] [place] - [facility] Dept Of [place]. [facility] Valente Gaskin, RPH-CPP   1 year ago Essential hypertension   [place] [facility] [place] - [facility] Dept Of [place]. [facility] Valente Gaskin, RPH-CPP       Future Appointments             In 1 month Lincoln Renshaw, Rexine Cater, MD [facility] [place] - A Dept Of [place]ag[place]. [facility]

## 2023-10-12 NOTE — Telephone Encounter (Signed)
 Requested Prescriptions  Pending Prescriptions Disp Refills   atorvastatin (LIPITOR) 10 MG tablet 90 tablet 1    Sig: Take 1 tablet (10 mg total) by mouth daily.     Cardiovascular:  Antilipid - Statins Failed - 10/12/2023  5:16 PM      Failed - Lipid Panel in normal range within the last 12 months    Cholesterol, Total  Date Value Ref Range Status  04/29/2022 165 100 - 199 mg/dL Final   LDL Chol Calc (NIH)  Date Value Ref Range Status  04/29/2022 99 0 - 99 mg/dL Final   HDL  Date Value Ref Range Status  04/29/2022 37 (L) >39 mg/dL Final   Triglycerides  Date Value Ref Range Status  04/29/2022 165 (H) 0 - 149 mg/dL Final         Passed - Patient is not pregnant      Passed - Valid encounter within last 12 months    Recent Outpatient Visits           2 months ago Essential hypertension   [place] [facility] [place] [place]. [facility] Lawrance Presume, MD   6 months ago Essential hypertension   [place] [facility] [place] - [facility] Dept Of [place]. [facility] Lawrance Presume, MD   11 months ago Essential hypertension   [place] [facility] [place] [facility] Dept Of [place]. [facility] Lawrance Presume, MD   1 year ago Essential hypertension   [place] [facility] [place] - [facility] Of [place]. [facility] Valente Gaskin, RPH-CPP   1 year ago Essential hypertension   [place] [facility] [place] - [facility] [place]. [facility] Valente Gaskin, RPH-CPP       Future Appointments             In 1 month Lincoln Renshaw, Rexine Cater, MD [facility] [place] - [facility] Dept Of [place] Frag[place]. [facility]             lisinopril (ZESTRIL) 20 MG tablet 90 tablet 1    Sig: Take 1 tablet (20 mg total) by mouth daily.     Cardiovascular:  ACE Inhibitors Failed - 10/12/2023  5:16 PM      Failed - Last BP in normal range    BP Readings from Last 1 Encounters:  08/31/23 (!) 132/92          Passed - Cr in normal range and within 180 days    Creatinine, Ser  Date Value Ref Range Status  05/30/2023 0.78 0.61 - 1.24 mg/dL Final         Passed - K in normal range and within 180 days    Potassium  Date Value Ref Range Status  05/30/2023 4.0 3.5 - 5.1 mmol/L Final         Passed - Patient is not pregnant      Passed - Valid encounter within last 6 months    Recent Outpatient Visits           2 months ago Essential hypertension   [place] [facility] [place] [facility] Dept Of [place]. [facility] Lawrance Presume, MD   6 months ago Essential hypertension    [facility] [place] - [facility] Dept Of [place]. [facility] Lawrance Presume, MD   11 months ago  Essential hypertension   [place] [facility] [place] - [facility] Dept Of [place]. [facility] Lawrance Presume, MD   1 year ago Essential hypertension   [place] [facility] [place] - A Dept Of [place]. [facility] Valente Gaskin, RPH-CPP   1 year ago Essential hypertension   [place] [facility] [place] - A Dept Of [place]. [facility] Valente Gaskin, RPH-CPP       Future Appointments             In 1 month Lincoln Renshaw, Rexine Cater, MD [facility] [place] - A Dept Of [place]ag[place]. [facility]

## 2023-10-13 ENCOUNTER — Other Ambulatory Visit: Payer: Self-pay

## 2023-11-09 ENCOUNTER — Other Ambulatory Visit: Payer: Self-pay | Admitting: Internal Medicine

## 2023-11-09 DIAGNOSIS — I10 Essential (primary) hypertension: Secondary | ICD-10-CM

## 2023-11-10 ENCOUNTER — Other Ambulatory Visit: Payer: Self-pay

## 2023-11-10 MED ORDER — AMLODIPINE BESYLATE 5 MG PO TABS
5.0000 mg | ORAL_TABLET | Freq: Every day | ORAL | 0 refills | Status: DC
  Filled 2023-11-10: qty 30, 30d supply, fill #0

## 2023-11-13 ENCOUNTER — Other Ambulatory Visit: Payer: Self-pay

## 2023-11-24 ENCOUNTER — Encounter: Payer: Self-pay | Admitting: Internal Medicine

## 2023-11-24 ENCOUNTER — Ambulatory Visit: Payer: Self-pay | Attending: Internal Medicine | Admitting: Internal Medicine

## 2023-11-24 ENCOUNTER — Other Ambulatory Visit: Payer: Self-pay

## 2023-11-24 VITALS — BP 120/90 | HR 104 | Ht 66.0 in | Wt 165.0 lb

## 2023-11-24 DIAGNOSIS — Z5320 Procedure and treatment not carried out because of patient's decision for unspecified reasons: Secondary | ICD-10-CM | POA: Diagnosis not present

## 2023-11-24 DIAGNOSIS — E782 Mixed hyperlipidemia: Secondary | ICD-10-CM

## 2023-11-24 DIAGNOSIS — I10 Essential (primary) hypertension: Secondary | ICD-10-CM | POA: Diagnosis not present

## 2023-11-24 DIAGNOSIS — F17210 Nicotine dependence, cigarettes, uncomplicated: Secondary | ICD-10-CM

## 2023-11-24 DIAGNOSIS — Z125 Encounter for screening for malignant neoplasm of prostate: Secondary | ICD-10-CM

## 2023-11-24 DIAGNOSIS — F17200 Nicotine dependence, unspecified, uncomplicated: Secondary | ICD-10-CM

## 2023-11-24 MED ORDER — AMLODIPINE BESYLATE 10 MG PO TABS
10.0000 mg | ORAL_TABLET | Freq: Every day | ORAL | 1 refills | Status: DC
  Filled 2023-11-24: qty 30, 30d supply, fill #0
  Filled 2024-01-04: qty 30, 30d supply, fill #1
  Filled 2024-02-22: qty 30, 30d supply, fill #2
  Filled 2024-04-30 – 2024-05-13 (×2): qty 30, 30d supply, fill #3

## 2023-11-24 NOTE — Patient Instructions (Signed)
 VISIT SUMMARY:  You came in today for your four-month follow-up visit. We discussed your current medications, blood pressure, cholesterol levels, smoking habits, and general health maintenance. You are doing well with your medications but need some adjustments for better blood pressure control. We also talked about the importance of quitting smoking and keeping up with your vaccinations and cancer screenings.  YOUR PLAN:  -HYPERTENSION: Hypertension means high blood pressure. Your blood pressure is not as well controlled as we would like, so we are increasing your amlodipine  dose to 10 mg daily. Please take two 5 mg tablets until your current supply is exhausted, and I have sent a prescription for the 10 mg tablets to your pharmacy. Continue taking lisinopril  20 mg daily.  -HYPERLIPIDEMIA: Hyperlipidemia means high cholesterol levels. You are currently managing this with atorvastatin  10 mg daily. We will check your cholesterol levels with a blood draw today.  -TOBACCO USE: You are currently smoking half a pack of cigarettes daily and are considering quitting. Smoking can contribute to high blood pressure and other health issues. I encourage you to quit smoking and will offer support whenever you are ready.  -HEALTH MAINTENANCE: You are due for some vaccinations and cancer screenings. We will schedule your second shingles vaccine, pneumonia vaccine, and flu shot for your September visit. We will also provide you with a Cologuard kit for colon cancer screening at your next visit.  INSTRUCTIONS:  Please follow up with the blood draw for cholesterol and PSA levels today. Continue taking your medications as prescribed, and start taking two 5 mg amlodipine  tablets daily until your current supply is exhausted. Your new prescription for 10 mg amlodipine  has been sent to your pharmacy. We will schedule your vaccinations and provide the Cologuard kit at your next visit in September.

## 2023-11-24 NOTE — Progress Notes (Signed)
 Patient ID: Duane Lambert, male    DOB: [birth year]  MRN: [id number]  CC: Hypertension (HTN f/u.)   Subjective: Duane Lambert is a 63 y.o. male who presents for chronic ds management. His concerns today include:  Hx of HTN, HL, elev PSA, tob dep.   Discussed the use of AI scribe software for clinical note transcription with the patient, who gave verbal consent to proceed.  History of Present Illness Duane Lambert is a 64 year old male with hypertension and hyperlipidemia who presents for a four month follow-up visit.  He takes amlodipine  5 mg daily, lisinopril  20 mg daily, and atorvastatin  10 mg daily, adhering to his medication regimen. He does not monitor his blood pressure at home. He denies chest pain or shortness of breath and avoids dietary salt. His last cholesterol check was in 2023, and he will have a blood draw today for cholesterol and PSA levels. He smokes half a pack of cigarettes daily and is not ready to give a trial of quitting.   HM: He has not completed colon cancer screening.  Given 2 FIT kit in the past which he has not turned in.  Cologuard test was ordered last year which he still has not turned in either.  Discussed the importance of colon cancer screening with him.  He wants to defer again on getting the pneumonia vaccine and second shingles vaccine.    Patient Active Problem List   Diagnosis Date Noted   Incarcerated inguinal hernia 12/22/2021   Mixed hyperlipidemia 09/20/2021   Tobacco dependence 09/20/2021   Elevated PSA 09/20/2021   Lipoma of head 07/22/2021   Essential hypertension 04/22/2020   Back pain 04/22/2020   Postop check 09/22/2011     Current Outpatient Medications on File Prior to Visit  Medication Sig Dispense Refill   acetaminophen  (TYLENOL ) 500 MG tablet Take 2 tablets (1,000 mg total) by mouth every 6 (six) hours as needed for mild pain. 30 tablet 0   atorvastatin  (LIPITOR) 10 MG tablet Take 1 tablet (10 mg total) by mouth daily.  90 tablet 0   Blood Pressure Monitoring (BLOOD PRESSURE CUFF) MISC Use to check blood pressure once daily. 1 each 0   lisinopril  (ZESTRIL ) 20 MG tablet Take 1 tablet (20 mg total) by mouth daily. 90 tablet 0   tadalafil  (CIALIS ) 20 MG tablet Take 1 tablet by mouth 1/2-1 hour prior to intercourse as needed. Do not take more than 1 tablet in 24 hours. 10 tablet 6   docusate sodium  (COLACE) 100 MG capsule Take 1 capsule (100 mg total) by mouth 2 (two) times daily. (Patient not taking: Reported on 11/24/2023) 10 capsule 0   fluticasone  (FLONASE ) 50 MCG/ACT nasal spray Place 1 spray into both nostrils daily. (Patient not taking: Reported on 11/24/2023) 16 g 0   polyethylene glycol (MIRALAX  / GLYCOLAX ) 17 g packet Take 17 g by mouth daily as needed for mild constipation. (Patient not taking: Reported on 11/24/2023) 14 each 0   No current facility-administered medications on file prior to visit.    No Known Allergies  Social History   Socioeconomic History   Marital status: Single    Spouse name: Not on file   Number of children: Not on file   Years of education: Not on file   Highest education level: Not on file  Occupational History   Not on file  Tobacco Use   Smoking status: Every Day    Current packs/day: 1.00  Types: Cigarettes   Smokeless tobacco: Current  Vaping Use   Vaping status: Never Used  Substance and Sexual Activity   Alcohol use: No   Drug use: Not Currently   Sexual activity: Not Currently  Other Topics Concern   Not on file  Social History Narrative   Not on file   Social Drivers of Health   Financial Resource Strain: Low Risk  (07/27/2023)   Overall Financial Resource Strain (CARDIA)    Difficulty of Paying Living Expenses: Not hard at all  Food Insecurity: No Food Insecurity (07/27/2023)   Hunger Vital Sign    Worried About Running Out of Food in the Last Year: Never true    Ran Out of Food in the Last Year: Never true  Transportation Needs: No  Transportation Needs (07/27/2023)   PRAPARE - Administrator, Civil Service (Medical): No    Lack of Transportation (Non-Medical): No  Physical Activity: Inactive (07/27/2023)   Exercise Vital Sign    Days of Exercise per Week: 0 days    Minutes of Exercise per Session: 0 min  Stress: No Stress Concern Present (07/27/2023)   [employer] of Occupational Health - Occupational Stress Questionnaire    Feeling of Stress : Not at all  Social Connections: Socially Isolated (07/27/2023)   Social Connection and Isolation Panel [NHANES]    Frequency of Communication with Friends and Family: Three times a week    Frequency of Social Gatherings with Friends and Family: Three times a week    Attends Religious Services: Never    Active Member of Clubs or Organizations: No    Attends Banker Meetings: Never    Marital Status: Never married  Intimate Partner Violence: Not At Risk (07/27/2023)   Humiliation, Afraid, Rape, and Kick questionnaire    Fear of Current or Ex-Partner: No    Emotionally Abused: No    Physically Abused: No    Sexually Abused: No    No family history on file.  Past Surgical History:  Procedure Laterality Date   APPENDECTOMY     INGUINAL HERNIA REPAIR Right 12/22/2021   Procedure: OPEN  INGUINAL HERNIA REPAIR POSSIBLE LAPAROTOMY;  Surgeon: Dorena Gander, MD;  [facility];  Service: General;  Laterality: Right;   LAPAROSCOPIC APPENDECTOMY  09/09/2011   Procedure: APPENDECTOMY LAPAROSCOPIC;  Surgeon: Arvin Laundry, MD;  Location: [facility] OR;  Service: General;  Laterality: N/A;    ROS: Review of Systems Negative except as stated above  PHYSICAL EXAM: BP (!) 120/90   Pulse (!) 104   Ht 5\' 6"  (1.676 m)   Wt 165 lb (74.8 kg)   SpO2 98%   BMI 26.63 kg/m   Physical Exam   General appearance - alert, well appearing, and in no distress Neck - supple, no significant adenopathy Chest - clear to auscultation, no wheezes, rales or rhonchi, symmetric  air entry Heart - normal rate, regular rhythm, normal S1, S2, no murmurs, rubs, clicks or gallops Extremities - peripheral pulses normal, no pedal edema, no clubbing or cyanosis     Latest Ref Rng & Units 05/30/2023    6:05 AM 04/29/2022    2:17 PM 12/23/2021    1:33 AM  CMP  Glucose 70 - 99 mg/dL 981   191   BUN 8 - 23 mg/dL 9   9   Creatinine 4.78 - 1.24 mg/dL 2.95   6.21   Sodium 308 - 145 mmol/L 129   134   Potassium 3.5 -  5.1 mmol/L 4.0   4.0   Chloride 98 - 111 mmol/L 98   101   CO2 22 - 32 mmol/L 20   24   Calcium  8.9 - 10.3 mg/dL 9.3   8.7   Total Protein 6.5 - 8.1 g/dL 7.9  6.9    Total Bilirubin <1.2 mg/dL 1.2  0.4    Alkaline Phos 38 - 126 U/L 71  76    AST 15 - 41 U/L 32  38    ALT 0 - 44 U/L 25  45     Lipid Panel     Component Value Date/Time   CHOL 165 04/29/2022 1417   TRIG 165 (H) 04/29/2022 1417   HDL 37 (L) 04/29/2022 1417   CHOLHDL 4.5 04/29/2022 1417   LDLCALC 99 04/29/2022 1417    CBC    Component Value Date/Time   WBC 9.5 05/30/2023 0605   RBC 5.06 05/30/2023 0605   HGB 14.7 05/30/2023 0605   HGB 14.6 07/27/2021 1247   HCT 43.0 05/30/2023 0605   HCT 44.5 07/27/2021 1247   PLT 190 05/30/2023 0605   PLT 195 07/27/2021 1247   MCV 85.0 05/30/2023 0605   MCV 88 07/27/2021 1247   MCH 29.1 05/30/2023 0605   MCHC 34.2 05/30/2023 0605   RDW 13.7 05/30/2023 0605   RDW 14.0 07/27/2021 1247   LYMPHSABS 1.7 05/30/2023 0605   LYMPHSABS 4.1 (H) 07/27/2021 1247   MONOABS 0.9 05/30/2023 0605   EOSABS 0.0 05/30/2023 0605   EOSABS 0.1 07/27/2021 1247   BASOSABS 0.0 05/30/2023 0605   BASOSABS 0.0 07/27/2021 1247    ASSESSMENT AND PLAN: 1. Essential hypertension (Primary) Not at goal.  Increase amlodipine  to 10 mg daily.  Continue lisinopril  20 mg daily. - amLODipine  (NORVASC ) 10 MG tablet; Take 1 tablet (10 mg total) by mouth daily.  Dispense: 90 tablet; Refill: 1  2. Tobacco dependence Strongly advised to quit smoking.  He is not ready to give a  trial of quitting.  3. Mixed hyperlipidemia Continue atorvastatin  10 mg daily. - Lipid panel  4. Prostate cancer screening Agreeable to prostate cancer screening with PSA level - PSA  5. Colon cancer screening declined  Patient was given the opportunity to ask questions.  Patient verbalized understanding of the plan and was able to repeat key elements of the plan.   This documentation was completed using Paediatric nurse.  Any transcriptional errors are unintentional.  Orders Placed This Encounter  Procedures   Lipid panel   PSA     Requested Prescriptions   Signed Prescriptions Disp Refills   amLODipine  (NORVASC ) 10 MG tablet 90 tablet 1    Sig: Take 1 tablet (10 mg total) by mouth daily.    Return in about 4 months (around 03/26/2024).  Concetta Dee, MD, FACP

## 2023-11-25 ENCOUNTER — Ambulatory Visit: Payer: Self-pay | Admitting: Internal Medicine

## 2023-11-25 DIAGNOSIS — R972 Elevated prostate specific antigen [PSA]: Secondary | ICD-10-CM

## 2023-11-25 LAB — LIPID PANEL
Chol/HDL Ratio: 3.8 ratio (ref 0.0–5.0)
Cholesterol, Total: 134 mg/dL (ref 100–199)
HDL: 35 mg/dL — ABNORMAL LOW (ref >39)
LDL Chol Calc (NIH): 74 mg/dL (ref 0–99)
Triglycerides: 141 mg/dL (ref 0–149)
VLDL Cholesterol Cal: 25 mg/dL (ref 5–40)

## 2023-11-25 LAB — PSA: Prostate Specific Ag, Serum: 5.4 ng/mL — ABNORMAL HIGH (ref 0.0–4.0)

## 2023-11-25 NOTE — Progress Notes (Signed)
 Cholesterol levels are good.  PSA, the screening test for prostate cancer, is elevated.  I will refer him to urology to evaluated further.

## 2023-11-29 NOTE — Telephone Encounter (Signed)
 Copied from CRM [id number]. Topic: General - Other >> Nov 28, 2023  5:44 PM Phil Braun wrote:  Reason for CRM: pt returned call after hours/ please call him back.

## 2023-12-14 ENCOUNTER — Other Ambulatory Visit: Payer: Self-pay

## 2023-12-15 ENCOUNTER — Other Ambulatory Visit: Payer: Self-pay

## 2024-01-04 ENCOUNTER — Other Ambulatory Visit: Payer: Self-pay

## 2024-01-04 ENCOUNTER — Other Ambulatory Visit: Payer: Self-pay | Admitting: Internal Medicine

## 2024-01-04 DIAGNOSIS — I10 Essential (primary) hypertension: Secondary | ICD-10-CM

## 2024-01-04 DIAGNOSIS — E782 Mixed hyperlipidemia: Secondary | ICD-10-CM

## 2024-01-04 MED ORDER — LISINOPRIL 20 MG PO TABS
20.0000 mg | ORAL_TABLET | Freq: Every day | ORAL | 0 refills | Status: DC
  Filled 2024-01-04: qty 90, 90d supply, fill #0
  Filled 2024-01-11: qty 30, 30d supply, fill #0
  Filled 2024-02-22: qty 30, 30d supply, fill #1
  Filled 2024-03-25: qty 30, 30d supply, fill #2

## 2024-01-04 MED ORDER — ATORVASTATIN CALCIUM 10 MG PO TABS
10.0000 mg | ORAL_TABLET | Freq: Every day | ORAL | 0 refills | Status: DC
  Filled 2024-01-04: qty 90, 90d supply, fill #0
  Filled 2024-01-11: qty 30, 30d supply, fill #0
  Filled 2024-02-22: qty 30, 30d supply, fill #1
  Filled 2024-03-25: qty 30, 30d supply, fill #2

## 2024-01-11 ENCOUNTER — Other Ambulatory Visit: Payer: Self-pay

## 2024-01-19 ENCOUNTER — Other Ambulatory Visit: Payer: Self-pay

## 2024-02-22 ENCOUNTER — Other Ambulatory Visit: Payer: Self-pay

## 2024-02-23 ENCOUNTER — Other Ambulatory Visit: Payer: Self-pay

## 2024-03-26 ENCOUNTER — Other Ambulatory Visit: Payer: Self-pay

## 2024-04-02 ENCOUNTER — Ambulatory Visit: Admitting: Internal Medicine

## 2024-04-25 ENCOUNTER — Other Ambulatory Visit: Payer: Self-pay | Admitting: Internal Medicine

## 2024-04-25 DIAGNOSIS — E782 Mixed hyperlipidemia: Secondary | ICD-10-CM

## 2024-04-25 DIAGNOSIS — I10 Essential (primary) hypertension: Secondary | ICD-10-CM

## 2024-04-26 ENCOUNTER — Other Ambulatory Visit: Payer: Self-pay

## 2024-04-26 MED ORDER — ATORVASTATIN CALCIUM 10 MG PO TABS
10.0000 mg | ORAL_TABLET | Freq: Every day | ORAL | 0 refills | Status: DC
  Filled 2024-04-26: qty 30, 30d supply, fill #0

## 2024-04-26 MED ORDER — LISINOPRIL 20 MG PO TABS
20.0000 mg | ORAL_TABLET | Freq: Every day | ORAL | 0 refills | Status: DC
  Filled 2024-04-26: qty 30, 30d supply, fill #0

## 2024-05-02 ENCOUNTER — Ambulatory Visit: Admitting: Internal Medicine

## 2024-05-02 ENCOUNTER — Other Ambulatory Visit: Payer: Self-pay

## 2024-05-06 ENCOUNTER — Other Ambulatory Visit (HOSPITAL_COMMUNITY): Payer: Self-pay

## 2024-05-09 ENCOUNTER — Other Ambulatory Visit: Payer: Self-pay

## 2024-05-13 ENCOUNTER — Other Ambulatory Visit: Payer: Self-pay

## 2024-05-26 ENCOUNTER — Other Ambulatory Visit: Payer: Self-pay | Admitting: Internal Medicine

## 2024-05-26 DIAGNOSIS — E782 Mixed hyperlipidemia: Secondary | ICD-10-CM

## 2024-05-26 DIAGNOSIS — I10 Essential (primary) hypertension: Secondary | ICD-10-CM

## 2024-05-26 MED ORDER — ATORVASTATIN CALCIUM 10 MG PO TABS
10.0000 mg | ORAL_TABLET | Freq: Every day | ORAL | 0 refills | Status: DC
  Filled 2024-05-26: qty 30, 30d supply, fill #0

## 2024-05-26 MED ORDER — LISINOPRIL 20 MG PO TABS
20.0000 mg | ORAL_TABLET | Freq: Every day | ORAL | 0 refills | Status: DC
  Filled 2024-05-26: qty 30, 30d supply, fill #0

## 2024-05-27 ENCOUNTER — Other Ambulatory Visit: Payer: Self-pay

## 2024-06-04 ENCOUNTER — Other Ambulatory Visit: Payer: Self-pay

## 2024-06-04 ENCOUNTER — Ambulatory Visit: Attending: Internal Medicine | Admitting: Internal Medicine

## 2024-06-04 VITALS — BP 104/67 | HR 100 | Temp 98.3°F | Ht 66.0 in | Wt 171.0 lb

## 2024-06-04 DIAGNOSIS — R972 Elevated prostate specific antigen [PSA]: Secondary | ICD-10-CM

## 2024-06-04 DIAGNOSIS — I10 Essential (primary) hypertension: Secondary | ICD-10-CM

## 2024-06-04 DIAGNOSIS — F17200 Nicotine dependence, unspecified, uncomplicated: Secondary | ICD-10-CM

## 2024-06-04 DIAGNOSIS — Z1211 Encounter for screening for malignant neoplasm of colon: Secondary | ICD-10-CM

## 2024-06-04 DIAGNOSIS — E782 Mixed hyperlipidemia: Secondary | ICD-10-CM

## 2024-06-04 DIAGNOSIS — N5201 Erectile dysfunction due to arterial insufficiency: Secondary | ICD-10-CM

## 2024-06-04 DIAGNOSIS — Z23 Encounter for immunization: Secondary | ICD-10-CM

## 2024-06-04 MED ORDER — TADALAFIL 20 MG PO TABS
ORAL_TABLET | ORAL | 6 refills | Status: AC
  Filled 2024-06-04: qty 10, 30d supply, fill #0
  Filled 2024-06-14: qty 10, 10d supply, fill #0

## 2024-06-04 MED ORDER — ATORVASTATIN CALCIUM 10 MG PO TABS
10.0000 mg | ORAL_TABLET | Freq: Every day | ORAL | 1 refills | Status: AC
  Filled 2024-06-04: qty 90, 90d supply, fill #0
  Filled 2024-06-25: qty 30, 30d supply, fill #0
  Filled 2024-07-15 – 2024-08-02 (×2): qty 30, 30d supply, fill #1

## 2024-06-04 MED ORDER — LISINOPRIL 20 MG PO TABS
20.0000 mg | ORAL_TABLET | Freq: Every day | ORAL | 1 refills | Status: AC
  Filled 2024-06-04: qty 90, 90d supply, fill #0
  Filled 2024-06-25: qty 30, 30d supply, fill #0
  Filled 2024-07-15 – 2024-08-02 (×2): qty 30, 30d supply, fill #1

## 2024-06-04 MED ORDER — AMLODIPINE BESYLATE 10 MG PO TABS
10.0000 mg | ORAL_TABLET | Freq: Every day | ORAL | 1 refills | Status: AC
  Filled 2024-06-04: qty 90, 90d supply, fill #0
  Filled 2024-06-10: qty 30, 30d supply, fill #0
  Filled 2024-07-15: qty 30, 30d supply, fill #1
  Filled 2024-08-02: qty 30, 30d supply, fill #2

## 2024-06-04 NOTE — Patient Instructions (Signed)
  VISIT SUMMARY: Duane Lambert, a 63 year old male with hypertension and hyperlipidemia, came in for a follow-up visit. He is managing his hypertension with lisinopril  and his hyperlipidemia with atorvastatin . He continues to smoke but is not ready to quit. He is now willing to proceed with a colonoscopy and follow up on his elevated PSA. He also received his second shingles vaccine today.  YOUR PLAN: -ESSENTIAL HYPERTENSION: Your blood pressure is well controlled with your current medication, lisinopril  20 mg daily. Please continue taking this medication and monitor your blood pressure regularly at work.  -MIXED HYPERLIPIDEMIA: Your cholesterol levels were excellent in May, and you are tolerating atorvastatin  10 mg daily well. Please continue taking this medication, and we will recheck your cholesterol levels in May next year.  -ERECTILE DYSFUNCTION DUE TO ARTERIAL INSUFFICIENCY: Your erectile dysfunction is being managed with Cialis , which you report is working well. Your prescription for Cialis  has been refilled.  -TOBACCO DEPENDENCE: You have been smoking half a pack daily for over 20 years. While you are not ready to quit, we encourage you to consider smoking cessation when you feel ready.  -GENERAL HEALTH MAINTENANCE: You are due for a PSA recheck and colon cancer screening. You have agreed to undergo a colonoscopy, and we have referred you to a gastroenterologist for this procedure. You also received your second shingles vaccine today.  INSTRUCTIONS: Please follow up with the urologist for your PSA recheck and schedule your colonoscopy with the gastroenterologist. Continue taking your medications as prescribed and monitor your blood pressure regularly. Consider smoking cessation when you feel ready.                      Contains text generated by [employer]bridge.                                 Contains text generated by Abridge.

## 2024-06-04 NOTE — Progress Notes (Signed)
 Patient ID: Duane Lambert, male    DOB: 24-Jul-1960  MRN: [id number]  CC: Hypertension (HTN f/u. Med refills. /No questions / concerns/No to flu & pneumonia. Shingles vax administered on 06/04/24 - C.A.)   Subjective: Duane Lambert is a 63 y.o. male who presents for chronic ds management. His concerns today include:  Hx of HTN, HL, elev PSA, tob dep.   Discussed the use of AI scribe software for clinical note transcription with the patient, who gave verbal consent to proceed.  History of Present Illness Duane Lambert is a 63 year old male with hypertension and hyperlipidemia who presents for follow-up.  He is currently managing his hypertension with lisinopril  20 mg daily and Norvasc  10 mg daily and adheres to a low-salt diet. He monitors his blood pressure at work approximately twice a month. No chest pain, shortness of breath, or leg swelling.  For hyperlipidemia, he continues to take atorvastatin  10 mg daily. His cholesterol levels were last checked in May and were reported as excellent.  He has a significant smoking history, currently smoking about half a pack per day for over 20 years. He is not ready to quit smoking at this time.  He was previously referred to a urologist for an elevated PSA but has not followed up due to fear of a bad outcome. He is now ready to reschedule and proceed with further evaluation.  HM: He has declined colon cancer screening in the past but is now willing to undergo a colonoscopy. He was previously given stool kits but did not return them.  He is also taking Cialis  for erectile dysfunction, which he reports is working well for him and request Rfs.     Patient Active Problem List   Diagnosis Date Noted   Incarcerated inguinal hernia 12/22/2021   Mixed hyperlipidemia 09/20/2021   Tobacco dependence 09/20/2021   Elevated PSA 09/20/2021   Lipoma of head 07/22/2021   Essential hypertension 04/22/2020   Back pain 04/22/2020   Postop check  09/22/2011     Current Outpatient Medications on File Prior to Visit  Medication Sig Dispense Refill   acetaminophen  (TYLENOL ) 500 MG tablet Take 2 tablets (1,000 mg total) by mouth every 6 (six) hours as needed for mild pain. 30 tablet 0   Blood Pressure Monitoring (BLOOD PRESSURE CUFF) MISC Use to check blood pressure once daily. 1 each 0   No current facility-administered medications on file prior to visit.    No Known Allergies  Social History   Socioeconomic History   Marital status: Single    Spouse name: Not on file   Number of children: Not on file   Years of education: Not on file   Highest education level: Not on file  Occupational History   Not on file  Tobacco Use   Smoking status: Every Day    Current packs/day: 1.00    Types: Cigarettes   Smokeless tobacco: Current  Vaping Use   Vaping status: Never Used  Substance and Sexual Activity   Alcohol use: No   Drug use: Not Currently   Sexual activity: Not Currently  Other Topics Concern   Not on file  Social History Narrative   Not on file   Social Drivers of Health   Financial Resource Strain: Low Risk  (07/27/2023)   Overall Financial Resource Strain (CARDIA)    Difficulty of Paying Living Expenses: Not hard at all  Food Insecurity: No Food Insecurity (07/27/2023)   Hunger Vital Sign  Worried About Programme Researcher, Broadcasting/film/video in the Last Year: Never true    Ran Out of Food in the Last Year: Never true  Transportation Needs: No Transportation Needs (07/27/2023)   PRAPARE - Administrator, Civil Service (Medical): No    Lack of Transportation (Non-Medical): No  Physical Activity: Inactive (07/27/2023)   Exercise Vital Sign    Days of Exercise per Week: 0 days    Minutes of Exercise per Session: 0 min  Stress: No Stress Concern Present (07/27/2023)   [employer] of Occupational Health - Occupational Stress Questionnaire    Feeling of Stress : Not at all  Social Connections: Socially Isolated  (07/27/2023)   Social Connection and Isolation Panel    Frequency of Communication with Friends and Family: Three times a week    Frequency of Social Gatherings with Friends and Family: Three times a week    Attends Religious Services: Never    Active Member of Clubs or Organizations: No    Attends Banker Meetings: Never    Marital Status: Never married  Intimate Partner Violence: Not At Risk (07/27/2023)   Humiliation, Afraid, Rape, and Kick questionnaire    Fear of Current or Ex-Partner: No    Emotionally Abused: No    Physically Abused: No    Sexually Abused: No    No family history on file.  Past Surgical History:  Procedure Laterality Date   APPENDECTOMY     INGUINAL HERNIA REPAIR Right 12/22/2021   Procedure: OPEN  INGUINAL HERNIA REPAIR POSSIBLE LAPAROTOMY;  Surgeon: Sebastian Moles, MD;  Location: [facility] OR;  Service: General;  Laterality: Right;   LAPAROSCOPIC APPENDECTOMY  09/09/2011   Procedure: APPENDECTOMY LAPAROSCOPIC;  Surgeon: Gordy Pina, MD;  Location: [facility] OR;  Service: General;  Laterality: N/A;    ROS: Review of Systems Negative except as stated above  PHYSICAL EXAM: BP 104/67 (BP Location: Left Arm, Patient Position: Sitting, Cuff Size: Large)   Pulse 100   Temp 98.3 F (36.8 C) (Oral)   Ht 5' 6 (1.676 m)   Wt 171 lb (77.6 kg)   SpO2 96%   BMI 27.60 kg/m   Physical Exam   General appearance - alert, well appearing, older AAM and in no distress Neck - supple, no significant adenopathy Chest - clear to auscultation, no wheezes, rales or rhonchi, symmetric air entry Heart - normal rate, regular rhythm, normal S1, S2, no murmurs, rubs, clicks or gallops Extremities - peripheral pulses normal, no pedal edema, no clubbing or cyanosis     Latest Ref Rng & Units 05/30/2023    6:05 AM 04/29/2022    2:17 PM 12/23/2021    1:33 AM  CMP  Glucose 70 - 99 mg/dL 890   878   BUN 8 - 23 mg/dL 9   9   Creatinine 9.38 - 1.24 mg/dL 9.21   9.19   Sodium 864  - 145 mmol/L 129   134   Potassium 3.5 - 5.1 mmol/L 4.0   4.0   Chloride 98 - 111 mmol/L 98   101   CO2 22 - 32 mmol/L 20   24   Calcium  8.9 - 10.3 mg/dL 9.3   8.7   Total Protein 6.5 - 8.1 g/dL 7.9  6.9    Total Bilirubin <1.2 mg/dL 1.2  0.4    Alkaline Phos 38 - 126 U/L 71  76    AST 15 - 41 U/L 32  38    ALT  0 - 44 U/L 25  45     Lipid Panel     Component Value Date/Time   CHOL 134 11/24/2023 1456   TRIG 141 11/24/2023 1456   HDL 35 (L) 11/24/2023 1456   CHOLHDL 3.8 11/24/2023 1456   LDLCALC 74 11/24/2023 1456    CBC    Component Value Date/Time   WBC 9.5 05/30/2023 0605   RBC 5.06 05/30/2023 0605   HGB 14.7 05/30/2023 0605   HGB 14.6 07/27/2021 1247   HCT 43.0 05/30/2023 0605   HCT 44.5 07/27/2021 1247   PLT 190 05/30/2023 0605   PLT 195 07/27/2021 1247   MCV 85.0 05/30/2023 0605   MCV 88 07/27/2021 1247   MCH 29.1 05/30/2023 0605   MCHC 34.2 05/30/2023 0605   RDW 13.7 05/30/2023 0605   RDW 14.0 07/27/2021 1247   LYMPHSABS 1.7 05/30/2023 0605   LYMPHSABS 4.1 (H) 07/27/2021 1247   MONOABS 0.9 05/30/2023 0605   EOSABS 0.0 05/30/2023 0605   EOSABS 0.1 07/27/2021 1247   BASOSABS 0.0 05/30/2023 0605   BASOSABS 0.0 07/27/2021 1247    ASSESSMENT AND PLAN: 1. Essential hypertension (Primary) At goal. Continue Norvasc  and Lisinopril  - amLODipine  (NORVASC ) 10 MG tablet; Take 1 tablet (10 mg total) by mouth daily.  Dispense: 90 tablet; Refill: 1 - lisinopril  (ZESTRIL ) 20 MG tablet; Take 1 tablet (20 mg total) by mouth daily.  Dispense: 90 tablet; Refill: 1 - CBC - Comprehensive metabolic panel with GFR  2. Mixed hyperlipidemia At goal. Continue Lipitor - atorvastatin  (LIPITOR) 10 MG tablet; Take 1 tablet (10 mg total) by mouth daily.  Dispense: 90 tablet; Refill: 1  3. Erectile dysfunction due to arterial insufficiency - tadalafil  (CIALIS ) 20 MG tablet; Take 1 tablet by mouth 1/2-1 hour prior to intercourse as needed. Do not take more than 1 tablet in 24 hours.   Dispense: 10 tablet; Refill: 6  4. Elevated PSA We will recheck level today first and if still elev, submit referral to urology which he stays he is willing to follow through on. - PSA  5. Tobacco dependence Advised to quit, Not ready to quit.  6. Screening for colon cancer - Ambulatory referral to Gastroenterology  7. Need for shingles vaccine 2nd shingrix  vaccine given today.    Patient was given the opportunity to ask questions.  Patient verbalized understanding of the plan and was able to repeat key elements of the plan.   This documentation was completed using Paediatric nurse.  Any transcriptional errors are unintentional.  Orders Placed This Encounter  Procedures   Varicella-zoster vaccine IM   PSA   CBC   Comprehensive metabolic panel with GFR   Ambulatory referral to Gastroenterology     Requested Prescriptions   Signed Prescriptions Disp Refills   amLODipine  (NORVASC ) 10 MG tablet 90 tablet 1    Sig: Take 1 tablet (10 mg total) by mouth daily.   atorvastatin  (LIPITOR) 10 MG tablet 90 tablet 1    Sig: Take 1 tablet (10 mg total) by mouth daily.   lisinopril  (ZESTRIL ) 20 MG tablet 90 tablet 1    Sig: Take 1 tablet (20 mg total) by mouth daily.   tadalafil  (CIALIS ) 20 MG tablet 10 tablet 6    Sig: Take 1 tablet by mouth 1/2-1 hour prior to intercourse as needed. Do not take more than 1 tablet in 24 hours.    Return in about 5 months (around 11/02/2024).  Barnie Louder, MD, FACP

## 2024-06-05 ENCOUNTER — Ambulatory Visit: Payer: Self-pay | Admitting: Internal Medicine

## 2024-06-05 DIAGNOSIS — R972 Elevated prostate specific antigen [PSA]: Secondary | ICD-10-CM

## 2024-06-05 LAB — COMPREHENSIVE METABOLIC PANEL WITH GFR
ALT: 37 IU/L (ref 0–44)
AST: 36 IU/L (ref 0–40)
Albumin: 4.4 g/dL (ref 3.9–4.9)
Alkaline Phosphatase: 95 IU/L (ref 47–123)
BUN/Creatinine Ratio: 15 (ref 10–24)
BUN: 15 mg/dL (ref 8–27)
Bilirubin Total: 0.3 mg/dL (ref 0.0–1.2)
CO2: 22 mmol/L (ref 20–29)
Calcium: 9.5 mg/dL (ref 8.6–10.2)
Chloride: 101 mmol/L (ref 96–106)
Creatinine, Ser: 0.97 mg/dL (ref 0.76–1.27)
Globulin, Total: 2.8 g/dL (ref 1.5–4.5)
Glucose: 95 mg/dL (ref 70–99)
Potassium: 4.3 mmol/L (ref 3.5–5.2)
Sodium: 137 mmol/L (ref 134–144)
Total Protein: 7.2 g/dL (ref 6.0–8.5)
eGFR: 88 mL/min/1.73 (ref >59)

## 2024-06-05 LAB — CBC
Hematocrit: 46.4 % (ref 37.5–51.0)
Hemoglobin: 14.9 g/dL (ref 13.0–17.7)
MCH: 28.9 pg (ref 26.6–33.0)
MCHC: 32.1 g/dL (ref 31.5–35.7)
MCV: 90 fL (ref 79–97)
Platelets: 225 x10E3/uL (ref 150–450)
RBC: 5.15 x10E6/uL (ref 4.14–5.80)
RDW: 13.1 % (ref 11.6–15.4)
WBC: 7.6 x10E3/uL (ref 3.4–10.8)

## 2024-06-05 LAB — PSA: Prostate Specific Ag, Serum: 5.8 ng/mL — ABNORMAL HIGH (ref 0.0–4.0)

## 2024-06-10 ENCOUNTER — Other Ambulatory Visit: Payer: Self-pay

## 2024-06-13 ENCOUNTER — Other Ambulatory Visit: Payer: Self-pay

## 2024-06-25 ENCOUNTER — Other Ambulatory Visit: Payer: Self-pay

## 2024-06-26 ENCOUNTER — Other Ambulatory Visit: Payer: Self-pay

## 2024-07-15 ENCOUNTER — Other Ambulatory Visit: Payer: Self-pay

## 2024-07-18 ENCOUNTER — Other Ambulatory Visit: Payer: Self-pay

## 2024-07-29 ENCOUNTER — Encounter: Payer: Self-pay | Admitting: Gastroenterology

## 2024-08-02 ENCOUNTER — Other Ambulatory Visit: Payer: Self-pay

## 2024-11-04 ENCOUNTER — Ambulatory Visit: Admitting: Internal Medicine
